# Patient Record
Sex: Female | Born: 1985 | Race: White | Hispanic: Yes | Marital: Single | Smoking: Never smoker
Health system: Southern US, Community
[De-identification: ages and names within clinical notes are randomized; demographics above are authoritative.]

## PROBLEM LIST (undated history)

## (undated) DIAGNOSIS — F419 Anxiety disorder, unspecified: Secondary | ICD-10-CM

## (undated) DIAGNOSIS — G932 Benign intracranial hypertension: Secondary | ICD-10-CM

## (undated) HISTORY — PX: NO PAST SURGERIES: SHX2092

## (undated) HISTORY — DX: Anxiety disorder, unspecified: F41.9

## (undated) HISTORY — DX: Benign intracranial hypertension: G93.2

---

## 2005-06-16 ENCOUNTER — Emergency Department (HOSPITAL_COMMUNITY): Admission: EM | Admit: 2005-06-16 | Discharge: 2005-06-16 | Payer: Self-pay | Admitting: Emergency Medicine

## 2005-06-17 ENCOUNTER — Emergency Department (HOSPITAL_COMMUNITY): Admission: EM | Admit: 2005-06-17 | Discharge: 2005-06-17 | Payer: Self-pay | Admitting: Emergency Medicine

## 2005-10-17 ENCOUNTER — Ambulatory Visit: Payer: Self-pay | Admitting: *Deleted

## 2005-10-21 ENCOUNTER — Ambulatory Visit: Payer: Self-pay | Admitting: *Deleted

## 2005-10-21 ENCOUNTER — Inpatient Hospital Stay (HOSPITAL_COMMUNITY): Admission: AD | Admit: 2005-10-21 | Discharge: 2005-10-24 | Payer: Self-pay | Admitting: Obstetrics & Gynecology

## 2005-10-21 ENCOUNTER — Ambulatory Visit: Payer: Self-pay | Admitting: Obstetrics and Gynecology

## 2006-07-21 ENCOUNTER — Ambulatory Visit: Payer: Self-pay | Admitting: Family Medicine

## 2006-07-22 ENCOUNTER — Ambulatory Visit: Payer: Self-pay | Admitting: *Deleted

## 2006-12-23 ENCOUNTER — Ambulatory Visit: Payer: Self-pay | Admitting: Family Medicine

## 2007-01-27 ENCOUNTER — Ambulatory Visit: Payer: Self-pay | Admitting: Family Medicine

## 2007-03-16 ENCOUNTER — Ambulatory Visit: Payer: Self-pay | Admitting: Family Medicine

## 2007-07-29 ENCOUNTER — Encounter (INDEPENDENT_AMBULATORY_CARE_PROVIDER_SITE_OTHER): Payer: Self-pay | Admitting: *Deleted

## 2008-09-05 ENCOUNTER — Encounter: Payer: Self-pay | Admitting: Obstetrics & Gynecology

## 2008-09-05 ENCOUNTER — Ambulatory Visit: Payer: Self-pay | Admitting: Obstetrics & Gynecology

## 2008-09-06 ENCOUNTER — Encounter: Payer: Self-pay | Admitting: Obstetrics & Gynecology

## 2008-10-29 ENCOUNTER — Emergency Department (HOSPITAL_COMMUNITY): Admission: EM | Admit: 2008-10-29 | Discharge: 2008-10-29 | Payer: Self-pay | Admitting: Emergency Medicine

## 2009-09-22 ENCOUNTER — Ambulatory Visit (HOSPITAL_COMMUNITY): Admission: RE | Admit: 2009-09-22 | Discharge: 2009-09-22 | Payer: Self-pay | Admitting: Obstetrics

## 2009-11-17 ENCOUNTER — Ambulatory Visit (HOSPITAL_COMMUNITY): Admission: RE | Admit: 2009-11-17 | Discharge: 2009-11-17 | Payer: Self-pay | Admitting: Obstetrics

## 2010-01-16 ENCOUNTER — Inpatient Hospital Stay (HOSPITAL_COMMUNITY): Admission: AD | Admit: 2010-01-16 | Discharge: 2010-01-16 | Payer: Self-pay | Admitting: Obstetrics

## 2010-01-18 ENCOUNTER — Inpatient Hospital Stay (HOSPITAL_COMMUNITY): Admission: AD | Admit: 2010-01-18 | Discharge: 2010-01-20 | Payer: Self-pay | Admitting: Obstetrics

## 2010-03-20 ENCOUNTER — Emergency Department (HOSPITAL_COMMUNITY): Admission: EM | Admit: 2010-03-20 | Discharge: 2010-03-20 | Payer: Self-pay | Admitting: Emergency Medicine

## 2011-01-29 LAB — COMPREHENSIVE METABOLIC PANEL
Alkaline Phosphatase: 175 U/L — ABNORMAL HIGH (ref 39–117)
CO2: 28 mEq/L (ref 19–32)
Calcium: 8.7 mg/dL (ref 8.4–10.5)
Glucose, Bld: 133 mg/dL — ABNORMAL HIGH (ref 70–99)
Potassium: 3.6 mEq/L (ref 3.5–5.1)
Sodium: 140 mEq/L (ref 135–145)

## 2011-01-29 LAB — CBC
Hemoglobin: 13.1 g/dL (ref 12.0–15.0)
MCHC: 35 g/dL (ref 30.0–36.0)

## 2011-01-29 LAB — POCT PREGNANCY, URINE: Preg Test, Ur: NEGATIVE

## 2011-01-29 LAB — DIFFERENTIAL
Basophils Absolute: 0 10*3/uL (ref 0.0–0.1)
Eosinophils Absolute: 0.1 10*3/uL (ref 0.0–0.7)
Lymphocytes Relative: 28 % (ref 12–46)
Monocytes Relative: 6 % (ref 3–12)
Neutrophils Relative %: 65 % (ref 43–77)

## 2011-01-29 LAB — URINALYSIS, ROUTINE W REFLEX MICROSCOPIC
Bilirubin Urine: NEGATIVE
Ketones, ur: NEGATIVE mg/dL
Leukocytes, UA: NEGATIVE
pH: 5 (ref 5.0–8.0)

## 2011-01-29 LAB — URINE MICROSCOPIC-ADD ON

## 2011-01-29 LAB — LIPASE, BLOOD: Lipase: 33 U/L (ref 11–59)

## 2011-02-03 LAB — CBC
HCT: 31.5 % — ABNORMAL LOW (ref 36.0–46.0)
Hemoglobin: 13.1 g/dL (ref 12.0–15.0)
MCHC: 33.2 g/dL (ref 30.0–36.0)
MCV: 90.5 fL (ref 78.0–100.0)
Platelets: 117 10*3/uL — ABNORMAL LOW (ref 150–400)
RBC: 4.42 MIL/uL (ref 3.87–5.11)
RDW: 14.1 % (ref 11.5–15.5)
WBC: 8.9 10*3/uL (ref 4.0–10.5)

## 2011-03-26 NOTE — Assessment & Plan Note (Signed)
NAMEZAKARI, Savannah [name]              ACCOUNT NO.:  [id number]   MEDICAL RECORD NO.:  [id number]          PATIENT TYPE:  POB   LOCATION:  [facility]         FACILITY:  [facility]   PHYSICIAN:  Elsie Lincoln, MD      DATE OF BIRTH:  Feb 14, 1986   DATE OF SERVICE:  09/05/2008                                  CLINIC NOTE   HISTORY:  The patient is a 25 year old G1, para 1-0-0-1, who comes to [place]  for yearly exam.  Her only complaint is that she has not had a period  for 3 months.  She has taken multiple home urine pregnancy tests, which  have been negative, and her urine pregnancy test here is also negative.  The patient would mind if she was pregnant; however, I do not believe  that she is.  She denies nipple discharge or lethargy, intolerance to  cold, or hair loss.  She has been having extremely normal periods with  moderate flow up until 3 months ago.   PAST MEDICAL HISTORY:  Denies all problems.   PAST SURGICAL HISTORY:  Denies all surgeries.   PAST GYN HISTORY:  No history of ovarian cysts, fibroid tumors, or  sexually transmitted diseases.  The patient did have an abnormal Pap  smear in 2008 during her pregnancy.  Her repeat Pap was normal and that  was just inflammation.  She uses condoms for birth control.   FAMILY HISTORY:  Negative for all familial cancer and her grandmother  has diabetes.   SOCIAL HISTORY:  The patient lives with her husband and son.  Does not  smoke, drink, or do drugs, but does have an occasional caffeinated  beverage.  Systemic review is negative for all problems.   MEDICATIONS:  None.   ALLERGIES:  None.   PHYSICAL EXAMINATION:  VITAL SIGNS:  Pulse 71, blood pressure 123/74,  weight 162.  GENERAL:  Well nourished, well developed, in no apparent distress.  HEENT:  Normocephalic, atraumatic.  NECK:  Thyroid, no masses.  LUNGS:  Clear to auscultation bilaterally.  HEART:  Regular rate and rhythm.  BREASTS:  No masses.  Nontender.  No  lymphadenopathy.  ABDOMEN:  Soft, nontender.  No organomegaly.  No hernia.  GENITALIA:  Tanner V.  Vagina pink, normal rugae.  Cervical os slightly  open and questionable endometrial polyp that can be seen and felt.  It  is not extruding from the os, but I do believe this is an endocervical  polyp.  It does not bleed after sex, the patient stated.  Uterus small,  nontender.  Adnexa no masses, nontender.  EXTREMITIES:  Nontender.  SKIN:  No hirsutism.  Of note, small amount of hair on the abdomen, but  nothing abnormal.   ASSESSMENT/PLAN:  A 25 year old female for a yearly exam.  1. Pap smear and cultures.  2. Secondary amenorrhea, which had quantitative TSH and prolactin, and      gave her Provera withdrawal bleed.  If the quantitative is      negative, she will call in a prescription for Provera to the [address]      [facility] [employer].  3. We could do more PCO workup  if these are negative.  The patient is      self-pay and I want to conduct test wisely.           ______________________________  Elsie Lincoln, MD     KL/MEDQ  D:  09/05/2008  T:  09/06/2008  Job:  [id number]

## 2012-07-15 LAB — OB RESULTS CONSOLE GC/CHLAMYDIA
Chlamydia: NEGATIVE
Gonorrhea: NEGATIVE

## 2012-07-15 LAB — OB RESULTS CONSOLE ABO/RH: RH Type: POSITIVE

## 2012-07-15 LAB — OB RESULTS CONSOLE HEPATITIS B SURFACE ANTIGEN: Hepatitis B Surface Ag: NEGATIVE

## 2012-11-11 NOTE — L&D Delivery Note (Signed)
Delivery Note At 12:52 AM a viable female was delivered via  (Presentation: ;  ).  APGAR: , ; weight .   Placenta status: , .  Cord:  with the following complications: .  Cord pH: not done  Anesthesia:   Episiotomy:  Lacerations:  Suture Repair: 2.0 vicryl Est. Blood Loss (mL):   Mom to postpartum.  Baby to nursery-stable.  MARSHALL,BERNARD A 01/08/2013, 1:12 AM

## 2012-12-08 LAB — OB RESULTS CONSOLE GBS: GBS: NEGATIVE

## 2013-01-06 ENCOUNTER — Telehealth (HOSPITAL_COMMUNITY): Payer: Self-pay | Admitting: *Deleted

## 2013-01-06 ENCOUNTER — Encounter (HOSPITAL_COMMUNITY): Payer: Self-pay | Admitting: *Deleted

## 2013-01-06 NOTE — Telephone Encounter (Signed)
Preadmission screen Interpreter number [phone]

## 2013-01-07 ENCOUNTER — Encounter (HOSPITAL_COMMUNITY): Payer: Self-pay | Admitting: Anesthesiology

## 2013-01-07 ENCOUNTER — Inpatient Hospital Stay (HOSPITAL_COMMUNITY): Payer: Medicaid Other | Admitting: Anesthesiology

## 2013-01-07 ENCOUNTER — Inpatient Hospital Stay (HOSPITAL_COMMUNITY)
Admission: AD | Admit: 2013-01-07 | Discharge: 2013-01-09 | DRG: 775 | Disposition: A | Discharge status: Home / Self Care | Payer: Medicaid Other | Source: Ambulatory Visit | Attending: Obstetrics | Admitting: Obstetrics

## 2013-01-07 ENCOUNTER — Encounter (HOSPITAL_COMMUNITY): Payer: Self-pay

## 2013-01-07 LAB — CBC
HCT: 36 % (ref 36.0–46.0)
Hemoglobin: 12.4 g/dL (ref 12.0–15.0)
MCH: 29.2 pg (ref 26.0–34.0)
MCHC: 34.4 g/dL (ref 30.0–36.0)
MCV: 84.9 fL (ref 78.0–100.0)
RDW: 13.6 % (ref 11.5–15.5)

## 2013-01-07 MED ORDER — FLEET ENEMA 7-19 GM/118ML RE ENEM: 1.0000 | ENEMA | Freq: Every day | RECTAL | Status: DC | PRN

## 2013-01-07 MED ORDER — PHENYLEPHRINE 40 MCG/ML (10ML) SYRINGE FOR IV PUSH (FOR BLOOD PRESSURE SUPPORT)
80.0000 ug | PREFILLED_SYRINGE | INTRAVENOUS | Status: DC | PRN
  Filled 2013-01-07: qty 5

## 2013-01-07 MED ORDER — OXYTOCIN 40 UNITS IN LACTATED RINGERS INFUSION - SIMPLE MED
1.0000 m[IU]/min | INTRAVENOUS | Status: DC
  Administered 2013-01-07: 2 m[IU]/min via INTRAVENOUS
  Filled 2013-01-07: qty 1000

## 2013-01-07 MED ORDER — OXYTOCIN BOLUS FROM INFUSION
500.0000 mL | INTRAVENOUS | Status: DC
  Administered 2013-01-08: 500 mL via INTRAVENOUS

## 2013-01-07 MED ORDER — ACETAMINOPHEN 325 MG PO TABS: 650.0000 mg | ORAL_TABLET | ORAL | Status: DC | PRN

## 2013-01-07 MED ORDER — LIDOCAINE HCL (PF) 1 % IJ SOLN
30.0000 mL | INTRAMUSCULAR | Status: AC | PRN
  Administered 2013-01-07: 30 mL via SUBCUTANEOUS
  Filled 2013-01-07 (×2): qty 30

## 2013-01-07 MED ORDER — LACTATED RINGERS IV SOLN
500.0000 mL | Freq: Once | INTRAVENOUS | Status: AC
  Administered 2013-01-07: 500 mL via INTRAVENOUS

## 2013-01-07 MED ORDER — CITRIC ACID-SODIUM CITRATE 334-500 MG/5ML PO SOLN: 30.0000 mL | ORAL | Status: DC | PRN

## 2013-01-07 MED ORDER — BUTORPHANOL TARTRATE 1 MG/ML IJ SOLN
1.0000 mg | INTRAMUSCULAR | Status: DC | PRN
  Administered 2013-01-07: 1 mg via INTRAVENOUS
  Filled 2013-01-07: qty 1

## 2013-01-07 MED ORDER — ONDANSETRON HCL 4 MG/2ML IJ SOLN: 4.0000 mg | Freq: Four times a day (QID) | INTRAMUSCULAR | Status: DC | PRN

## 2013-01-07 MED ORDER — LACTATED RINGERS IV SOLN
INTRAVENOUS | Status: DC
  Administered 2013-01-07 – 2013-01-08 (×2): via INTRAVENOUS

## 2013-01-07 MED ORDER — SODIUM BICARBONATE 8.4 % IV SOLN
INTRAVENOUS | Status: DC | PRN
  Administered 2013-01-07: 5 mL via EPIDURAL

## 2013-01-07 MED ORDER — OXYCODONE-ACETAMINOPHEN 5-325 MG PO TABS
1.0000 | ORAL_TABLET | ORAL | Status: DC | PRN
  Administered 2013-01-08: 1 via ORAL
  Filled 2013-01-07 (×2): qty 1

## 2013-01-07 MED ORDER — PHENYLEPHRINE 40 MCG/ML (10ML) SYRINGE FOR IV PUSH (FOR BLOOD PRESSURE SUPPORT): 80.0000 ug | PREFILLED_SYRINGE | INTRAVENOUS | Status: DC | PRN

## 2013-01-07 MED ORDER — IBUPROFEN 600 MG PO TABS
600.0000 mg | ORAL_TABLET | Freq: Four times a day (QID) | ORAL | Status: DC | PRN
  Filled 2013-01-07 (×5): qty 1

## 2013-01-07 MED ORDER — EPHEDRINE 5 MG/ML INJ
10.0000 mg | INTRAVENOUS | Status: DC | PRN
  Filled 2013-01-07: qty 4

## 2013-01-07 MED ORDER — OXYTOCIN 40 UNITS IN LACTATED RINGERS INFUSION - SIMPLE MED: 62.5000 mL/h | INTRAVENOUS | Status: DC

## 2013-01-07 MED ORDER — TERBUTALINE SULFATE 1 MG/ML IJ SOLN: 0.2500 mg | Freq: Once | INTRAMUSCULAR | Status: AC | PRN

## 2013-01-07 MED ORDER — DIPHENHYDRAMINE HCL 50 MG/ML IJ SOLN: 12.5000 mg | INTRAMUSCULAR | Status: DC | PRN

## 2013-01-07 MED ORDER — FENTANYL 2.5 MCG/ML BUPIVACAINE 1/10 % EPIDURAL INFUSION (WH - ANES)
14.0000 mL/h | INTRAMUSCULAR | Status: DC
  Administered 2013-01-07: 14 mL/h via EPIDURAL
  Filled 2013-01-07: qty 125

## 2013-01-07 MED ORDER — LACTATED RINGERS IV SOLN
500.0000 mL | INTRAVENOUS | Status: DC | PRN
  Administered 2013-01-08: 500 mL via INTRAVENOUS

## 2013-01-07 MED ORDER — EPHEDRINE 5 MG/ML INJ: 10.0000 mg | INTRAVENOUS | Status: DC | PRN

## 2013-01-07 NOTE — Anesthesia Procedure Notes (Signed)

## 2013-01-07 NOTE — MAU Note (Addendum)
PT SAYS UC HURT - Q15 MIN.    VE - 3 CM ON  Tuesday.   HAS BROWNISH D/C.    DENIES HSV AND MRSA.   INDUCTION ON 01-12-2013 AT 0730

## 2013-01-07 NOTE — MAU Note (Signed)
Pt states having uc's since yesterday and are closer together this evening.

## 2013-01-07 NOTE — Anesthesia Preprocedure Evaluation (Signed)

## 2013-01-08 ENCOUNTER — Encounter (HOSPITAL_COMMUNITY): Payer: Self-pay | Admitting: Family Medicine

## 2013-01-08 LAB — CBC
Hemoglobin: 10.9 g/dL — ABNORMAL LOW (ref 12.0–15.0)
RBC: 3.79 MIL/uL — ABNORMAL LOW (ref 3.87–5.11)
WBC: 14 10*3/uL — ABNORMAL HIGH (ref 4.0–10.5)

## 2013-01-08 LAB — ABO/RH: ABO/RH(D): A POS

## 2013-01-08 MED ORDER — ZOLPIDEM TARTRATE 5 MG PO TABS: 5.0000 mg | ORAL_TABLET | Freq: Every evening | ORAL | Status: DC | PRN

## 2013-01-08 MED ORDER — BENZOCAINE-MENTHOL 20-0.5 % EX AERO
1.0000 "application " | INHALATION_SPRAY | CUTANEOUS | Status: DC | PRN
  Administered 2013-01-08: 1 via TOPICAL
  Filled 2013-01-08: qty 56

## 2013-01-08 MED ORDER — ONDANSETRON HCL 4 MG/2ML IJ SOLN: 4.0000 mg | INTRAMUSCULAR | Status: DC | PRN

## 2013-01-08 MED ORDER — TETANUS-DIPHTH-ACELL PERTUSSIS 5-2.5-18.5 LF-MCG/0.5 IM SUSP
0.5000 mL | Freq: Once | INTRAMUSCULAR | Status: AC
  Administered 2013-01-09: 0.5 mL via INTRAMUSCULAR

## 2013-01-08 MED ORDER — WITCH HAZEL-GLYCERIN EX PADS
1.0000 "application " | MEDICATED_PAD | CUTANEOUS | Status: DC | PRN
  Administered 2013-01-08: 1 via TOPICAL

## 2013-01-08 MED ORDER — DIBUCAINE 1 % RE OINT
1.0000 "application " | TOPICAL_OINTMENT | RECTAL | Status: DC | PRN
  Administered 2013-01-08: 1 via RECTAL
  Filled 2013-01-08: qty 28

## 2013-01-08 MED ORDER — OXYCODONE-ACETAMINOPHEN 5-325 MG PO TABS
1.0000 | ORAL_TABLET | ORAL | Status: DC | PRN
  Administered 2013-01-08: 1 via ORAL

## 2013-01-08 MED ORDER — PRENATAL MULTIVITAMIN CH
1.0000 | ORAL_TABLET | Freq: Every day | ORAL | Status: DC
  Administered 2013-01-08: 1 via ORAL
  Filled 2013-01-08: qty 1

## 2013-01-08 MED ORDER — DIPHENHYDRAMINE HCL 25 MG PO CAPS: 25.0000 mg | ORAL_CAPSULE | Freq: Four times a day (QID) | ORAL | Status: DC | PRN

## 2013-01-08 MED ORDER — SENNOSIDES-DOCUSATE SODIUM 8.6-50 MG PO TABS
2.0000 | ORAL_TABLET | Freq: Every day | ORAL | Status: DC
  Administered 2013-01-08: 2 via ORAL

## 2013-01-08 MED ORDER — ONDANSETRON HCL 4 MG PO TABS: 4.0000 mg | ORAL_TABLET | ORAL | Status: DC | PRN

## 2013-01-08 MED ORDER — FERROUS SULFATE 325 (65 FE) MG PO TABS
325.0000 mg | ORAL_TABLET | Freq: Two times a day (BID) | ORAL | Status: DC
  Administered 2013-01-08 – 2013-01-09 (×3): 325 mg via ORAL
  Filled 2013-01-08 (×3): qty 1

## 2013-01-08 MED ORDER — LANOLIN HYDROUS EX OINT: TOPICAL_OINTMENT | CUTANEOUS | Status: DC | PRN

## 2013-01-08 MED ORDER — IBUPROFEN 600 MG PO TABS
600.0000 mg | ORAL_TABLET | Freq: Four times a day (QID) | ORAL | Status: DC
  Administered 2013-01-08 – 2013-01-09 (×6): 600 mg via ORAL
  Filled 2013-01-08: qty 1

## 2013-01-08 MED ORDER — SIMETHICONE 80 MG PO CHEW: 80.0000 mg | CHEWABLE_TABLET | ORAL | Status: DC | PRN

## 2013-01-08 NOTE — Progress Notes (Signed)

## 2013-01-08 NOTE — Progress Notes (Signed)
UR chart review completed.  

## 2013-01-08 NOTE — Progress Notes (Signed)
Patient ID: Savannah [name], female   DOB: [birth year], 27 y.o.   MRN: [id number] vs signs normal fundus firm Doing well

## 2013-01-08 NOTE — Anesthesia Postprocedure Evaluation (Signed)
  Anesthesia Post-op Note  Patient: Savannah [name]  Procedure(s) Performed: * No procedures listed *  Patient Location: Mother/Baby  Anesthesia Type:Epidural  Level of Consciousness: awake, alert  and oriented  Airway and Oxygen Therapy: Patient Spontanous Breathing  Post-op Pain: mild  Post-op Assessment: Patient's Cardiovascular Status Stable and Respiratory Function Stable  Post-op Vital Signs: stable  Complications: No apparent anesthesia complications

## 2013-01-08 NOTE — H&P (Signed)
This is Dr. Francoise Ceo dictating the history and physical on  Savannah [name] she's a 27 year old gravida 3 para 202 at 40 weeks and 3 days EDC 01/05/2013 negative GBS admitted in labor 5 cm 100% and made rapid progress membranes ruptured artificially when she was fully dilated at a normal vaginal delivery of a female Apgar 8 and 9 placenta spontaneous and she had a second-degree perineal which was repaired with 2-0 Vicryl Past medical history negative Past surgical history negative Social history negative System review noncontributory Physical exam revealed a well-developed female post delivery HEENT negative Breasts negative Lungs clear to P&A Heart regular rhythm no murmurs no gallops Uterus 20 week postpartum size Extremities negative and

## 2013-01-09 MED ORDER — OXYCODONE-ACETAMINOPHEN 5-325 MG PO TABS: 1.0000 | ORAL_TABLET | Freq: Four times a day (QID) | ORAL | Status: DC | PRN

## 2013-01-09 MED ORDER — PRENATAL MULTIVITAMIN CH: 1.0000 | ORAL_TABLET | Freq: Every day | ORAL | Status: DC

## 2013-01-09 NOTE — Discharge Summary (Signed)
  Obstetric Discharge Summary Reason for Admission: onset of labor Prenatal Procedures: none Intrapartum Procedures: spontaneous vaginal delivery Postpartum Procedures: none Complications-Operative and Postpartum: none  Hemoglobin  Date Value Range Status  01/08/2013 10.9* 12.0 - 15.0 g/dL Final     HCT  Date Value Range Status  01/08/2013 32.4* 36.0 - 46.0 % Final    Physical Exam:  General: alert Lochia: appropriate Uterine: firm Incision: n/a DVT Evaluation: No evidence of DVT seen on physical exam.  Discharge Diagnoses: Active Problems:   Normal delivery   Discharge Information: Date: 01/09/2013 Activity: pelvic rest Diet: routine Medications:  Prior to Admission medications   Medication Sig Start Date End Date Taking? Authorizing Provider  oxyCODONE-acetaminophen (PERCOCET/ROXICET) 5-325 MG per tablet Take 1-2 tablets by mouth every 6 (six) hours as needed. 01/09/13   Antionette Char, MD    Condition: stable Instructions: refer to routine discharge instructions Discharge to: home Follow-up Information   Schedule an appointment as soon as possible for a visit with Kathreen Cosier, MD.   Contact information:   [address] [place] [address] [phone]       Newborn Data: Live born  Information for the patient's newborn:  Shayanna, Thatch [[id number]]  female ; APGAR (1 MIN): 8   APGAR (5 MINS): 9    Home with mother.  JACKSON-MOORE,Oleda Borski A 01/09/2013, 9:55 AM

## 2013-01-12 ENCOUNTER — Inpatient Hospital Stay (HOSPITAL_COMMUNITY): Admission: RE | Admit: 2013-01-12 | Payer: Self-pay | Source: Ambulatory Visit

## 2014-09-12 ENCOUNTER — Encounter (HOSPITAL_COMMUNITY): Payer: Self-pay | Admitting: Family Medicine

## 2016-08-02 LAB — HM PAP SMEAR

## 2017-03-31 ENCOUNTER — Emergency Department (HOSPITAL_COMMUNITY)
Admission: EM | Admit: 2017-03-31 | Discharge: 2017-04-01 | Disposition: A | Discharge status: Home / Self Care | Payer: Medicaid Other | Attending: Emergency Medicine | Admitting: Emergency Medicine

## 2017-03-31 ENCOUNTER — Emergency Department (HOSPITAL_COMMUNITY): Payer: Medicaid Other

## 2017-03-31 ENCOUNTER — Encounter (HOSPITAL_COMMUNITY): Payer: Self-pay | Admitting: Emergency Medicine

## 2017-03-31 DIAGNOSIS — R930 Abnormal findings on diagnostic imaging of skull and head, not elsewhere classified: Secondary | ICD-10-CM | POA: Insufficient documentation

## 2017-03-31 DIAGNOSIS — R519 Headache, unspecified: Secondary | ICD-10-CM

## 2017-03-31 DIAGNOSIS — G932 Benign intracranial hypertension: Secondary | ICD-10-CM

## 2017-03-31 DIAGNOSIS — R51 Headache: Secondary | ICD-10-CM

## 2017-03-31 LAB — BASIC METABOLIC PANEL
ANION GAP: 6 (ref 5–15)
BUN: 15 mg/dL (ref 6–20)
CO2: 26 mmol/L (ref 22–32)
Calcium: 9.3 mg/dL (ref 8.9–10.3)
Chloride: 107 mmol/L (ref 101–111)
Creatinine, Ser: 0.89 mg/dL (ref 0.44–1.00)
GFR calc Af Amer: >60 mL/min (ref >60)
GLUCOSE: 101 mg/dL — AB (ref 65–99)
POTASSIUM: 3.8 mmol/L (ref 3.5–5.1)
Sodium: 139 mmol/L (ref 135–145)

## 2017-03-31 LAB — CBC WITH DIFFERENTIAL/PLATELET
Basophils Absolute: 0 10*3/uL (ref 0.0–0.1)
Basophils Relative: 0 %
Eosinophils Absolute: 0.1 10*3/uL (ref 0.0–0.7)
Eosinophils Relative: 2 %
HEMATOCRIT: 41.6 % (ref 36.0–46.0)
HEMOGLOBIN: 14.3 g/dL (ref 12.0–15.0)
LYMPHS PCT: 37 %
Lymphs Abs: 2.8 10*3/uL (ref 0.7–4.0)
MCH: 31.1 pg (ref 26.0–34.0)
MCHC: 34.4 g/dL (ref 30.0–36.0)
MCV: 90.4 fL (ref 78.0–100.0)
Monocytes Absolute: 0.6 10*3/uL (ref 0.1–1.0)
Monocytes Relative: 8 %
NEUTROS ABS: 4.1 10*3/uL (ref 1.7–7.7)
NEUTROS PCT: 53 %
PLATELETS: 235 10*3/uL (ref 150–400)
RBC: 4.6 MIL/uL (ref 3.87–5.11)
RDW: 12.7 % (ref 11.5–15.5)
WBC: 7.6 10*3/uL (ref 4.0–10.5)

## 2017-03-31 LAB — I-STAT BETA HCG BLOOD, ED (MC, WL, AP ONLY)

## 2017-03-31 MED ORDER — ACETAZOLAMIDE 250 MG PO TABS
500.0000 mg | ORAL_TABLET | Freq: Once | ORAL | Status: AC
  Administered 2017-03-31: 500 mg via ORAL
  Filled 2017-03-31: qty 2

## 2017-03-31 MED ORDER — LIDOCAINE HCL 2 % IJ SOLN
10.0000 mL | Freq: Once | INTRAMUSCULAR | Status: AC
  Administered 2017-03-31: 200 mg
  Filled 2017-03-31: qty 20

## 2017-03-31 MED ORDER — OXYCODONE-ACETAMINOPHEN 5-325 MG PO TABS
1.0000 | ORAL_TABLET | Freq: Once | ORAL | Status: AC
  Administered 2017-03-31: 1 via ORAL
  Filled 2017-03-31: qty 1

## 2017-03-31 MED ORDER — LIDOCAINE HCL 2 % IJ SOLN
10.0000 mL | Freq: Once | INTRAMUSCULAR | Status: AC
  Administered 2017-03-31: 200 mg

## 2017-03-31 NOTE — ED Notes (Signed)
Pt states she "went to the eye dr for my glasses and he told me to come here". Pt has paper with her from Dr stating bilateral optic nerve edema. Pt denies any issue with vision outside of needing glasses.

## 2017-03-31 NOTE — ED Notes (Signed)
Pt transported to MRI 

## 2017-03-31 NOTE — ED Notes (Signed)
Attempted to get pt's blood twice, without success.

## 2017-03-31 NOTE — ED Notes (Signed)
ED Provider at bedside. 

## 2017-03-31 NOTE — ED Triage Notes (Signed)
Sent to ED from Eye dr with OPTIC NERVE EDEMA to be examined -- was sent to neurology office and was told that they could not see her.

## 2017-03-31 NOTE — ED Provider Notes (Signed)
[facility]EMERGENCY DEPT Provider Note   CSN: [id number] Arrival date & time: 03/31/17  1317     History   Chief Complaint Chief Complaint  Patient presents with  . optic nerve edema    HPI Savannah [name] is a 31 y.o. female.  The history is provided by the patient. No language interpreter was used.   Savannah [name] is a 31 y.o. female who presents to the Emergency Department complaining of optic nerve edema.  She went for [facility] appointment [facility] for evaluation and was referred to the emergency department for bilateral optic nerve edema. She reports headaches for the last 2 weeks across the front of her head. The headaches are at times worse with noise. She has intermittent blurred vision. No fevers, chest pain, cough, bowel pain, nausea, vomiting, numbness, weakness. Symptoms are mild and waxing and waning in nature. Past Medical History:  Diagnosis Date  . Anxiety     Patient Active Problem List   Diagnosis Date Noted  . Normal delivery 01/09/2013    Past Surgical History:  Procedure Laterality Date  . NO PAST SURGERIES      OB History    Gravida Para Term Preterm AB Living   3 3 3  0 0 3   SAB TAB Ectopic Multiple Live Births   0 0 0 0 3       Home Medications    Prior to Admission medications   Medication Sig Start Date End Date Taking? Authorizing Provider  oxyCODONE-acetaminophen (PERCOCET/ROXICET) 5-325 MG per tablet Take 1-2 tablets by mouth every 6 (six) hours as needed. 01/09/13   Antionette CharJackson-Moore, Lisa, MD  Prenatal Vit-Fe Fumarate-FA (PRENATAL MULTIVITAMIN) TABS Take 1 tablet by mouth daily at 12 noon. 01/09/13   Antionette CharJackson-Moore, Lisa, MD    Family History No family history on file.  Social History Social History  Substance Use Topics  . Smoking status: Never Smoker  . Smokeless tobacco: Never Used  . Alcohol use No     Allergies   Patient has no known allergies.   Review of Systems Review of Systems    All other systems reviewed and are negative.    Physical Exam Updated Vital Signs BP 130/85 (BP Location: Left Arm)   Pulse 66   Temp 98.1 F (36.7 C) (Oral)   Resp 16   Ht 5\' 5"  (1.651 m)   Wt 78.5 kg (173 lb)   SpO2 100%   BMI 28.79 kg/m   Physical Exam  Constitutional: She is oriented to person, place, and time. She appears well-developed and well-nourished.  HENT:  Head: Normocephalic and atraumatic.  Eyes: EOM are normal. Pupils are equal, round, and reactive to light.  Cardiovascular: Normal rate and regular rhythm.   No murmur heard. Pulmonary/Chest: Effort normal and breath sounds normal. No respiratory distress.  Abdominal: Soft. There is no tenderness. There is no rebound and no guarding.  Musculoskeletal: She exhibits no edema or tenderness.  Neurological: She is alert and oriented to person, place, and time. No cranial nerve deficit or sensory deficit. Coordination normal.  5 out of 5 strength in all 4 extremities.  Skin: Skin is warm and dry.  Psychiatric: She has a normal mood and affect. Her behavior is normal.  Nursing note and vitals reviewed.    ED Treatments / Results  Labs (all labs ordered are listed, but only abnormal results are displayed) Labs Reviewed  CBC WITH DIFFERENTIAL/PLATELET  BASIC METABOLIC PANEL  EKG  EKG Interpretation None       Radiology Ct Head Wo Contrast  Result Date: 03/31/2017 CLINICAL DATA:  31 y/o F; bilateral optic nerve edema on eye examination today. EXAM: CT HEAD WITHOUT CONTRAST TECHNIQUE: Contiguous axial images were obtained from the base of the skull through the vertex without intravenous contrast. COMPARISON:  None. FINDINGS: Brain: No evidence of acute infarction, hemorrhage, hydrocephalus, extra-axial collection or mass lesion/mass effect. Partially empty sella turcica. Vascular: No hyperdense vessel or unexpected calcification. Skull: Normal. Negative for fracture or focal lesion. Sinuses/Orbits: No  acute finding. Other: None. IMPRESSION: 1. No acute intracranial abnormality. 2. Incidental partially empty sella turcica. This finding can be seen with idiopathic intracranial hypertension. 3. Otherwise unremarkable CT of head for age. Electronically Signed   By: Mitzi Hansen M.D.   On: 03/31/2017 15:31    Procedures .Lumbar Puncture Date/Time: 04/01/2017 1:14 AM Performed by: Tilden Fossa Authorized by: Tilden Fossa   Consent:    Consent obtained:  Verbal and written   Consent given by:  Patient   Risks discussed:  Bleeding, infection, pain, headache, nerve damage and repeat procedure   Alternatives discussed:  Observation Pre-procedure details:    Procedure purpose:  Diagnostic   Preparation: Patient was prepped and draped in usual sterile fashion   Anesthesia (see MAR for exact dosages):    Anesthesia method:  Local infiltration   Local anesthetic:  Lidocaine 2% w/o epi Procedure details:    Lumbar space:  L3-L4 interspace   Patient position:  L lateral decubitus   Needle gauge:  22   Needle length (in):  3.5   Number of attempts:  1   Opening pressure (cm H2O):  40   Closing pressure (cm H2O):  27   Fluid appearance:  Clear   Tubes of fluid:  4   Total volume (ml):  6 Post-procedure:    Puncture site:  Adhesive bandage applied   Patient tolerance of procedure:  Tolerated well, no immediate complications   (including critical care time)  Medications Ordered in ED Medications - No data to display   Initial Impression / Assessment and Plan / ED Course  I have reviewed the triage vital signs and the nursing notes.  Pertinent labs & imaging results that were available during my care of the patient were reviewed by me and considered in my medical decision making (see chart for details).     Patient here following referral from ophthalmology for papilledema. She is in no distress in the emergency department with a nonfocal neurologic examination.  Discussed with Dr. Amada Jupiter with neurology, recommend obtain MR venogram and if no evidence of dural sinus thrombosis obtain lumbar puncture to violate for pseudotumor. Lumbar puncture performed demonstrates elevated opening pressure and 40. Discussed the case with Dr. Otelia Limes with neurology, recommend starting Diamox 500 mg twice a day with close neurology and ophthalmology follow-up. Case management consulted for assistance in establishing follow up care.    Final Clinical Impressions(s) / ED Diagnoses   Final diagnoses:  None    New Prescriptions New Prescriptions   No medications on file     Tilden Fossa, MD 04/01/17 2034

## 2017-04-01 LAB — CSF CELL COUNT WITH DIFFERENTIAL
RBC COUNT CSF: 54 /mm3 — AB
RBC Count, CSF: 3 /mm3 — ABNORMAL HIGH
Tube #: 1
Tube #: 4
WBC CSF: 1 /mm3 (ref 0–5)
WBC CSF: 2 /mm3 (ref 0–5)

## 2017-04-01 LAB — CRYPTOCOCCAL ANTIGEN, CSF: Crypto Ag: NEGATIVE

## 2017-04-01 LAB — GLUCOSE, CSF: GLUCOSE CSF: 60 mg/dL (ref 40–70)

## 2017-04-01 LAB — PROTEIN, CSF: TOTAL PROTEIN, CSF: 42 mg/dL (ref 15–45)

## 2017-04-01 MED ORDER — ACETAZOLAMIDE 250 MG PO TABS: 500.0000 mg | ORAL_TABLET | Freq: Two times a day (BID) | ORAL | 0 refills | Status: DC

## 2017-04-01 MED ORDER — IBUPROFEN 800 MG PO TABS
800.0000 mg | ORAL_TABLET | Freq: Once | ORAL | Status: AC
  Administered 2017-04-01: 800 mg via ORAL
  Filled 2017-04-01: qty 1

## 2017-04-01 NOTE — ED Notes (Signed)
ED Provider at bedside. 

## 2017-04-02 ENCOUNTER — Telehealth: Payer: Self-pay | Admitting: Surgery

## 2017-04-02 NOTE — Telephone Encounter (Signed)
ED CM received consult from Dr. Pecola Leisureeese concerning patient needing ED folow up. Patient was seen yesterday, and she would like her to receive follow up within the next 7 days. Patient is uninsured and does not have a PCP. ED CM contacted the [facility] to see if they can accommodate patient, state they will reach out to patient for scheduling. Updated Dr. Pecola Leisureeese. No further ED CM needs identified

## 2017-04-03 ENCOUNTER — Encounter: Payer: Self-pay | Admitting: Neurology

## 2017-04-03 ENCOUNTER — Ambulatory Visit (INDEPENDENT_AMBULATORY_CARE_PROVIDER_SITE_OTHER): Payer: Self-pay | Admitting: Neurology

## 2017-04-03 DIAGNOSIS — G932 Benign intracranial hypertension: Secondary | ICD-10-CM

## 2017-04-03 HISTORY — DX: Benign intracranial hypertension: G93.2

## 2017-04-03 MED ORDER — ACETAZOLAMIDE 250 MG PO TABS: 500.0000 mg | ORAL_TABLET | Freq: Two times a day (BID) | ORAL | 1 refills | Status: DC

## 2017-04-03 NOTE — Progress Notes (Signed)
Reason for visit: Papilledema  Referring physician: Dr. Jonny RuizLe  Savannah [name] is a 31 y.o. female  History of present illness:  [name] is a 31 year old right-handed Hispanic female with a two-week history of headaches. The patient went in for a routine eye examination and saw Dr. Conley RollsLe, who noted bilateral papilledema. The patient had noted headaches only for 2 weeks prior, the headaches are bifrontal in nature and are associated with neck stiffness. The patient may have some ringing in the ears that comes and goes and some pain in the ears. She went to the emergency room and underwent a CT scan of the brain that was normal, MRV of the head was also done and did not show definite venous sinus thrombosis. The patient has undergone lumbar puncture that was relatively unremarkable, spinal fluid protein level was normal. The patient was placed on Diamox at 500 mg twice daily. The patient has had some numbness and tingling in the hands and feet on the medication. The patient is still currently having headaches, but she only went on the medication on 03/31/2017. The patient reports no weakness of the extremities, no change in balance, no problems controlling the bowels or the bladder. She uses an IUD for birth control, this has been in place since 2016. The patient is sent to this office for further management.  Past Medical History:  Diagnosis Date  . Anxiety   . Pseudotumor cerebri 04/03/2017    Past Surgical History:  Procedure Laterality Date  . NO PAST SURGERIES      Family History  Problem Relation Age of Onset  . Diabetes Mother   . Diabetes Father   . High blood pressure Father     Social history:  reports that she has never smoked. She has never used smokeless tobacco. She reports that she does not drink alcohol or use drugs.  Medications:  Prior to Admission medications   Medication Sig Start Date End Date Taking? Authorizing Provider  acetaZOLAMIDE (DIAMOX) 250  MG tablet Take 2 tablets (500 mg total) by mouth 2 (two) times daily. 04/01/17  Yes Tilden Fossaees, Elizabeth, MD  levonorgestrel [facility](MIRENA) 20 MCG/24HR IUD 1 each by Intrauterine route once. Implanted November 2017   Yes [provider]  Multiple Vitamin (MULTIVITAMIN WITH MINERALS) TABS tablet Take 1 tablet by mouth daily.   Yes [provider]     No Known Allergies  ROS:  Out of a complete 14 system review of symptoms, the patient complains only of the following symptoms, and all other reviewed systems are negative.  Headache  Blood pressure 119/82, pulse 66, height 5\' 5"  (1.651 m), weight 177 lb (80.3 kg), unknown if currently breastfeeding.  Physical Exam  General: The patient is alert and cooperative at the time of the examination.  Eyes: Pupils are equal, round, and reactive to light. Disc margins are slightly blurred bilaterally. No hemorrhages are noted.  Neck: The neck is supple, no carotid bruits are noted.  Respiratory: The respiratory examination is clear.  Cardiovascular: The cardiovascular examination reveals a regular rate and rhythm, no obvious murmurs or rubs are noted.  Skin: Extremities are without significant edema.  Neurologic Exam  Mental status: The patient is alert and oriented x 3 at the time of the examination. The patient has apparent normal recent and remote memory, with an apparently normal attention span and concentration ability.  Cranial nerves: Facial symmetry is present. There is good sensation of the face to pinprick and soft touch  bilaterally. The strength of the facial muscles and the muscles to head turning and shoulder shrug are normal bilaterally. Speech is well enunciated, no aphasia or dysarthria is noted. Extraocular movements are full. Visual fields are full. The tongue is midline, and the patient has symmetric elevation of the soft palate. No obvious hearing deficits are noted.  Motor: The motor testing reveals 5 over 5 strength  of all 4 extremities. Good symmetric motor tone is noted throughout.  Sensory: Sensory testing is intact to pinprick, soft touch, vibration sensation, and position sense on all 4 extremities. No evidence of extinction is noted.  Coordination: Cerebellar testing reveals good finger-nose-finger and heel-to-shin bilaterally.  Gait and station: Gait is normal. Tandem gait is normal. Romberg is negative. No drift is seen.  Reflexes: Deep tendon reflexes are symmetric and normal bilaterally. Toes are downgoing bilaterally.   Assessment/Plan:  1. Pseudotumor cerebri  The patient is not markedly overweight. The patient will remain on Diamox for now, she will follow-up in about 3 months. She will call if her headaches worsen, a repeat spinal tap may be done. The patient was given a prescription for Diamox.  Marlan Palau MD 04/03/2017 11:52 AM  [facility] [address] [place], [place] [address]  Phone [phone] Fax [phone]

## 2017-04-04 LAB — CSF CULTURE W GRAM STAIN

## 2017-04-04 LAB — CSF CULTURE: CULTURE: NO GROWTH

## 2017-04-22 ENCOUNTER — Ambulatory Visit (INDEPENDENT_AMBULATORY_CARE_PROVIDER_SITE_OTHER): Payer: Medicaid Other | Admitting: Physician Assistant

## 2017-04-22 ENCOUNTER — Encounter (INDEPENDENT_AMBULATORY_CARE_PROVIDER_SITE_OTHER): Payer: Self-pay | Admitting: Physician Assistant

## 2017-04-22 VITALS — BP 121/72 | HR 60 | Temp 97.9°F | Ht 64.5 in | Wt 175.4 lb

## 2017-04-22 DIAGNOSIS — H4710 Unspecified papilledema: Secondary | ICD-10-CM

## 2017-04-22 MED ORDER — ACETAZOLAMIDE 250 MG PO TABS: 500.0000 mg | ORAL_TABLET | Freq: Two times a day (BID) | ORAL | 5 refills | Status: DC

## 2017-04-22 NOTE — Patient Instructions (Signed)
Idiopathic Intracranial Hypertension Idiopathic intracranial hypertension (IIH) is a condition that increases pressure around the brain. The fluid that surrounds the brain and spinal cord (cerebrospinal fluid, CSF) increases and causes the pressure. Idiopathic means that the cause of this condition is not known. IIH affects the brain and spinal cord (is a neurological disorder). If this condition is not treated, it can cause vision loss or blindness. What increases the risk? You are more likely to develop this condition if:  You are severely overweight (obese).  You are a woman who has not gone through menopause.  You take certain medicines, such as birth control or steroids.  What are the signs or symptoms? Symptoms of IIH include:  Headaches. This is the most common symptom.  Pain in the shoulders or neck.  Nausea and vomiting.  A "rushing water" or pulsing sound within the ears (pulsatile tinnitus).  Double vision.  Blurred vision.  Brief episodes of complete vision loss.  How is this diagnosed? This condition may be diagnosed based on:  Your symptoms.  Your medical history.  CT scan of the brain.  MRI of the brain.  Magnetic resonance venogram (MRV) to check veins in the brain.  Diagnostic lumbar puncture. This is a procedure to remove and examine a sample of cerebrospinal fluid. This procedure can determine whether too much fluid may be causing IIH.  A thorough eye exam to check for swelling or nerve damage in the eyes.  How is this treated? Treatment for this condition depends on your symptoms. The goal of treatment is to decrease the pressure around your brain. Common treatments include:  Medicines to decrease the production of spinal fluid and lower the pressure within your skull.  Medicines to prevent or treat headaches.  Surgery to place drains (shunts) in your brain to remove excess fluid.  Lumbar puncture to remove excess cerebrospinal  fluid.  Follow these instructions at home:  If you are overweight or obese, work with your health care provider to lose weight.  Take over-the-counter and prescription medicines only as told by your health care provider.  Do not drive or use heavy machinery while taking medicines that can make you sleepy.  Keep all follow-up visits as told by your health care provider. This is important. Contact a health care provider if:  You have changes in your vision, such as: ? Double vision. ? Not being able to see colors (color vision). Get help right away if:  You have any of the following symptoms and they get worse or do not get better. ? Headaches. ? Nausea. ? Vomiting. ? Vision changes or difficulty seeing. Summary  Idiopathic intracranial hypertension (IIH) is a condition that increases pressure around the brain. The cause is not known (is idiopathic).  The most common symptom of IIH is headaches.  Treatment may include medicines or surgery to relieve the pressure on your brain. This information is not intended to replace advice given to you by your health care provider. Make sure you discuss any questions you have with your health care provider. Document Released: 01/06/2002 Document Revised: 09/18/2016 Document Reviewed: 09/18/2016 Elsevier Interactive Patient Education  2017 [employer]  

## 2017-04-22 NOTE — Progress Notes (Signed)
Subjective:  Patient ID: Savannah [name], female    DOB: [birth year]  Age: 31 y.o. MRN: [id number]  CC: papilledema  HPI Savannah [name] is a 31 y.o. female with a PMH of anxiety presents on follow up of papilledema. ED visit on 03/31/17 after being told by ophthalmology to go to ED for papilledema. Ultimately had a LP done which revealed opening pressure of 40. MRI revealed atypical venous antomy without convincing evidence of acute dural venous sinus thrombosis. CT head  Prescribed Diamox 500 mg BID. Has recently been to neurology and was told to continue on Diamox. She will return for a neurology f/u visit in August. Denies headache, visual disturbances, nausea, vomiting, stiff neck, fever, chills, CP, SOB, abdominal pain, or GI/GU sxs.     Outpatient Medications Prior to Visit  Medication Sig Dispense Refill  . acetaZOLAMIDE (DIAMOX) 250 MG tablet Take 2 tablets (500 mg total) by mouth 2 (two) times daily. 180 tablet 1  . levonorgestrel (MIRENA) 20 MCG/24HR IUD 1 each by Intrauterine route once. Implanted November 2017    . Multiple Vitamin (MULTIVITAMIN WITH MINERALS) TABS tablet Take 1 tablet by mouth daily.     No facility-administered medications prior to visit.      ROS Review of Systems  Constitutional: Negative for chills, fever and malaise/fatigue.  Eyes: Negative for blurred vision.  Respiratory: Negative for shortness of breath.   Cardiovascular: Negative for chest pain and palpitations.  Gastrointestinal: Negative for abdominal pain and nausea.  Genitourinary: Negative for dysuria and hematuria.  Musculoskeletal: Negative for joint pain and myalgias.  Skin: Negative for rash.  Neurological: Negative for tingling and headaches (not currently).  Psychiatric/Behavioral: Negative for depression. The patient is not nervous/anxious.     Objective:  BP 121/72 (BP Location: Left Arm, Patient Position: Sitting, Cuff Size: Normal)   Pulse 60   Temp 97.9 F  (36.6 C) (Oral)   Ht 5' 4.5" (1.638 m)   Wt 175 lb 6.4 oz (79.6 kg)   LMP  (Exact Date)   SpO2 98%   Breastfeeding? No   BMI 29.64 kg/m   BP/Weight 04/22/2017 04/03/2017 04/01/2017  Systolic BP 121 119 115  Diastolic BP 72 82 78  Wt. (Lbs) 175.4 177 -  BMI 29.64 29.45 -      Physical Exam  Constitutional: She is oriented to person, place, and time.  Well developed, well nourished, NAD, polite  HENT:  Head: Normocephalic and atraumatic.  Eyes: Conjunctivae and EOM are normal. Pupils are equal, round, and reactive to light. No scleral icterus.  Neck: Normal range of motion. Neck supple.  Cardiovascular: Normal rate, regular rhythm and normal heart sounds.   No carotid bruits  Pulmonary/Chest: Effort normal and breath sounds normal.  Musculoskeletal: She exhibits no edema.  Neurological: She is alert and oriented to person, place, and time. She has normal reflexes. No cranial nerve deficit. Coordination normal.  Strength 5/5 throughout  Skin: Skin is warm and dry. No rash noted. No erythema. No pallor.  Psychiatric: She has a normal mood and affect. Her behavior is normal. Thought content normal.  Vitals reviewed.    Assessment & Plan:   1. Papilledema - Refill acetaZOLAMIDE (DIAMOX) 250 MG tablet; Take 2 tablets (500 mg total) by mouth 2 (two) times daily.  Dispense: 120 tablet; Refill: 5 - MR MRV HEAD W CONTRAST; Future. 2/2 abnormal venous anatomy. - Keep f/u appointment with neurology - Return to your ophthalmologist - Lipid Panel; Future - TSH; Future  Meds ordered this encounter  Medications  . acetaZOLAMIDE (DIAMOX) 250 MG tablet    Sig: Take 2 tablets (500 mg total) by mouth 2 (two) times daily.    Dispense:  120 tablet    Refill:  5    Order Specific Question:   Supervising Provider    Answer:   Quentin AngstJEGEDE, OLUGBEMIGA E [id number]]    Follow-up: Return in about 4 weeks (around 05/20/2017) for Papilledema.   Loletta Specteroger David Aalia Greulich PA

## 2017-04-29 ENCOUNTER — Other Ambulatory Visit (INDEPENDENT_AMBULATORY_CARE_PROVIDER_SITE_OTHER): Payer: Self-pay

## 2017-04-29 DIAGNOSIS — H4710 Unspecified papilledema: Secondary | ICD-10-CM

## 2017-04-30 LAB — TSH: TSH: 2.03 u[IU]/mL (ref 0.450–4.500)

## 2017-04-30 LAB — LIPID PANEL
Chol/HDL Ratio: 3.1 ratio (ref 0.0–4.4)
Cholesterol, Total: 115 mg/dL (ref 100–199)
HDL: 37 mg/dL — AB (ref >39)
LDL Calculated: 56 mg/dL (ref 0–99)
TRIGLYCERIDES: 109 mg/dL (ref 0–149)
VLDL Cholesterol Cal: 22 mg/dL (ref 5–40)

## 2017-05-07 ENCOUNTER — Ambulatory Visit (HOSPITAL_COMMUNITY)
Admission: RE | Admit: 2017-05-07 | Discharge: 2017-05-07 | Disposition: A | Discharge status: Home / Self Care | Payer: Self-pay | Source: Ambulatory Visit | Attending: Physician Assistant | Admitting: Physician Assistant

## 2017-05-07 DIAGNOSIS — H4710 Unspecified papilledema: Secondary | ICD-10-CM | POA: Insufficient documentation

## 2017-05-22 ENCOUNTER — Encounter (INDEPENDENT_AMBULATORY_CARE_PROVIDER_SITE_OTHER): Payer: Self-pay | Admitting: Physician Assistant

## 2017-05-22 ENCOUNTER — Ambulatory Visit (INDEPENDENT_AMBULATORY_CARE_PROVIDER_SITE_OTHER): Payer: Self-pay | Admitting: Physician Assistant

## 2017-05-22 VITALS — BP 112/69 | HR 54 | Temp 97.8°F | Wt 173.4 lb

## 2017-05-22 DIAGNOSIS — G932 Benign intracranial hypertension: Secondary | ICD-10-CM

## 2017-05-22 DIAGNOSIS — H4710 Unspecified papilledema: Secondary | ICD-10-CM

## 2017-05-22 NOTE — Progress Notes (Signed)
Subjective:  Patient ID: Savannah Savannah [name], female    DOB: 09/18/1986  Age: 31 y.o. MRN: [id number]  CC: f/u MRV, papilledema and Pseudomotor cerebri   HPI  Savannah [name] is a 31 y.o. female with a PMH of anxiety, papilledema, and pseudomotor cerebri presents on follow up of papilledema and pseudomotor cerebri. ED visit on 03/31/17 after being told by ophthalmology to go to ED for papilledema. Ultimately had a LP done which revealed opening pressure of 40. MRI revealed atypical venous antomy without convincing evidence of acute dural venous sinus thrombosis. 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 Prescribed Diamox 500 mg BID. She will return for a neurology f/u visit on June 25, 2017. Reports side effects with Diamox including bilateral elbow pain and altered taste sensation. Has not returned to her ophthalmologist yet for f/u of papilledema. Says her ophthalmologist sees patients on a walk in basis. Denies headache, visual disturbances, nausea, vomiting, stiff neck, fever, chills, CP, SOB, abdominal pain, or GI/GU sxs.    Outpatient Medications Prior to Visit  Medication Sig Dispense Refill  . acetaZOLAMIDE (DIAMOX) 250 MG tablet Take 2 tablets (500 mg total) by mouth 2 (two) times daily. 120 tablet 5  . levonorgestrel (MIRENA) 20 MCG/24HR IUD 1 each by Intrauterine route once. Implanted November 2017    . Multiple Vitamin (MULTIVITAMIN WITH MINERALS) TABS tablet Take 1 tablet by mouth daily.     No facility-administered medications prior to visit.      ROS Review of Systems  Constitutional: Negative for chills, fever and malaise/fatigue.  Eyes: Negative for blurred vision.  Respiratory: Negative for shortness of breath.   Cardiovascular: Negative for chest pain and palpitations.  Gastrointestinal: Negative for abdominal pain and nausea.   Genitourinary: Negative for dysuria and hematuria.  Musculoskeletal: Positive for joint pain. Negative for myalgias.  Skin: Negative for rash.  Neurological: Negative for tingling and headaches.       Altered taste  Psychiatric/Behavioral: Negative for depression. The patient is not nervous/anxious.     Objective:  BP 112/69 (BP Location: Left Arm, Patient Position: Sitting, Cuff Size: Normal)   Pulse (!) 54   Temp 97.8 F (36.6 C) (Oral)   Wt 173 lb 6.4 oz (78.7 kg)   SpO2 98%   BMI 29.30 kg/m   BP/Weight 05/22/2017 04/22/2017 04/03/2017  Systolic BP 112 121 119  Diastolic BP 69 72 82  Wt. (Lbs) 173.4 175.4 177  BMI 29.3 29.64 29.45      Physical Exam  Constitutional: She is oriented to person, place, and time.  Well developed, overweight, NAD, polite  HENT:  Head: Normocephalic and atraumatic.  Eyes: Pupils are equal, round, and reactive to light. Conjunctivae are normal. No scleral icterus.  Cardiovascular: Normal rate, regular rhythm and normal heart sounds.   No carotid bruit  Pulmonary/Chest: Effort normal and breath sounds normal.  Musculoskeletal: She exhibits no edema.  Neurological: She is alert and oriented to person, place, and time. No cranial nerve deficit. Coordination normal.  Skin: Skin is warm and dry. No rash noted. No erythema. No pallor.  Psychiatric: She has a normal mood and affect. Her behavior is normal. Thought content normal.  Vitals reviewed.    Assessment & Plan:   1. Pseudotumor cerebri - Comprehensive metabolic panel - Advised to keep appointment with [facility] on 06/25/17. - Continue taking Diamox as directed.  2. Papilledema -  Comprehensive metabolic panel - Ambulatory referral to Ophthalmology    Follow-up: Return in about 2 months (around 07/23/2017).   Loletta Specter PA

## 2017-05-22 NOTE — Patient Instructions (Signed)
Control de la presin intracraneal (Intracranial Pressure Monitoring) La presin intracraneal es la presin en el crneo. El control de la presin intracraneal se realiza despus de una lesin en el cerebro que provoca hinchazn. Le permite al mdico medir y observar la presin y los cambios de presin dentro del crneo. Se [place] como una gua para el [place]tratamiento. CMO SE CONTROLA LA PRESIN INTRACRANEAL? Existen tres formas principales para Scientist, physiologicalcontrolar la presin intracraneal:  Mediante la colocacin de un tubo delgado y flexible (catter) a travs de una perforacin en uno de los espacios llenos de lquido (ventrculos) del cerebro.  Mediante la colocacin de un tornillo o perno hueco en el espacio entre el cerebro, sus membranas y el crneo.  Mediante la colocacin de un pequeo sensor de presin a travs de una perforacin en el crneo. HAY ALGN RIESGO ASOCIADO CON EL CONTROL DE LA PRESIN INTRACRANEAL? Por lo general, el control de la presin intracraneal es un procedimiento seguro. Sin embargo, Tree surgeoncomo en cualquier procedimiento, pueden surgir complicaciones. Las complicaciones posibles son:  Infeccin.  Fallas en el procedimiento.  Hemorragias.  Lesin en el tejido cerebral con problemas neurolgicos permanentes. QU SUCEDE SI LA PRESIN INTRACRANEAL ES ALTA? Un aumento de la presin intracraneal puede ser peligroso. Si la presin es alta, el cerebro posiblemente no funcione tan bien como antes de la lesin. Si se eleva ms que la presin arterial, el cerebro dejar de recibir Tajikistansangre y es posible que se produzca muerte cerebral. Para prevenir que esto suceda, si la presin intracraneal es elevada, se drenar el lquido en el cerebro, llamado lquido cefalorraqudeo. El lquido se puede Medical laboratory scientific officerdrenar mientras se Geographical information systems officerrealiza el control, en el caso de que se use el mtodo con el catter, el tornillo o el perno. Esta informacin no tiene Theme park managercomo fin reemplazar el consejo del mdico. Asegrese de hacerle al  mdico cualquier pregunta que tenga. Document Released: 08/18/2013 Document Revised: 08/18/2013 Document Reviewed: 07/05/2013 Elsevier Interactive Patient Education  2017 [employer]

## 2017-05-23 LAB — COMPREHENSIVE METABOLIC PANEL
ALBUMIN: 4.6 g/dL (ref 3.5–5.5)
ALT: 37 IU/L — ABNORMAL HIGH (ref 0–32)
AST: 18 IU/L (ref 0–40)
Albumin/Globulin Ratio: 1.5 (ref 1.2–2.2)
Alkaline Phosphatase: 110 IU/L (ref 39–117)
BUN / CREAT RATIO: 16 (ref 9–23)
BUN: 16 mg/dL (ref 6–20)
Bilirubin Total: 0.5 mg/dL (ref 0.0–1.2)
CALCIUM: 9.5 mg/dL (ref 8.7–10.2)
CO2: 16 mmol/L — ABNORMAL LOW (ref 20–29)
Chloride: 109 mmol/L — ABNORMAL HIGH (ref 96–106)
Creatinine, Ser: 0.98 mg/dL (ref 0.57–1.00)
GFR calc Af Amer: 88 mL/min/{1.73_m2} (ref >59)
GFR, EST NON AFRICAN AMERICAN: 76 mL/min/{1.73_m2} (ref >59)
GLOBULIN, TOTAL: 3 g/dL (ref 1.5–4.5)
Glucose: 81 mg/dL (ref 65–99)
Potassium: 3.6 mmol/L (ref 3.5–5.2)
SODIUM: 143 mmol/L (ref 134–144)
TOTAL PROTEIN: 7.6 g/dL (ref 6.0–8.5)

## 2017-06-06 ENCOUNTER — Encounter (INDEPENDENT_AMBULATORY_CARE_PROVIDER_SITE_OTHER): Payer: Self-pay

## 2017-06-25 ENCOUNTER — Encounter: Payer: Self-pay | Admitting: Adult Health

## 2017-06-25 ENCOUNTER — Ambulatory Visit (INDEPENDENT_AMBULATORY_CARE_PROVIDER_SITE_OTHER): Payer: Self-pay | Admitting: Adult Health

## 2017-06-25 VITALS — BP 112/74 | HR 63 | Ht 64.5 in | Wt 174.2 lb

## 2017-06-25 DIAGNOSIS — G932 Benign intracranial hypertension: Secondary | ICD-10-CM

## 2017-06-25 NOTE — Progress Notes (Signed)
PATIENT: Savannah [name] DOB: 1986/03/22  REASON FOR VISIT: follow up-pseudotumor cerebri HISTORY FROM: patient and interpreter  HISTORY OF PRESENT ILLNESS: Today 06/25/17 Savannah [name] is a 31 year old female with a history of pseudotumor cerebri. She returns today for follow-up. The patient does not speak [place]. An interpreter is present. The patient continues on Diamox 500 mg twice a day. She states that essentially her headaches have resolved. She reports occasionally she will get a headache if her children are loud however she states that if she goes into a quiet room her headache typically resolves within 30 minutes. She denies any visual disturbances including loss of vision and double vision. Denies any back pain. Denies tinnitus. She reports initially she had trouble tolerating Diamox however she is tolerating it fine now. She reports that she did see her ophthalmologist in July and she reports that she was told that the papilledema was mild. I have not seen this report. She returns today for an evaluation.  HISTORY 04/03/17: Savannah [name] is a 31 year old right-handed Hispanic female with a two-week history of headaches. The patient went in for a routine eye examination and saw Dr. Conley Rolls, who noted bilateral papilledema. The patient had noted headaches only for 2 weeks prior, the headaches are bifrontal in nature and are associated with neck stiffness. The patient may have some ringing in the ears that comes and goes and some pain in the ears. She went to the emergency room and underwent a CT scan of the brain that was normal, MRV of the head was also done and did not show definite venous sinus thrombosis. The patient has undergone lumbar puncture that was relatively unremarkable, spinal fluid protein level was normal. The patient was placed on Diamox at 500 mg twice daily. The patient has had some numbness and tingling in the hands and feet on the medication. The patient is still  currently having headaches, but she only went on the medication on 03/31/2017. The patient reports no weakness of the extremities, no change in balance, no problems controlling the bowels or the bladder. She uses an IUD for birth control, this has been in place since 2016. The patient is sent to this office for further management.  REVIEW OF SYSTEMS: Out of a complete 14 system review of symptoms, the patient complains only of the following symptoms, and all other reviewed systems are negative.  Fatigue, eye itching, eye redness, eye pain, swollen abdomen, abdominal pain, restless leg, insomnia, daytime sleepiness, frequency of urination, joint pain, headache, numbness, depression, bruise/bleed easily  ALLERGIES: No Known Allergies  HOME MEDICATIONS: Outpatient Medications Prior to Visit  Medication Sig Dispense Refill  . acetaZOLAMIDE (DIAMOX) 250 MG tablet Take 2 tablets (500 mg total) by mouth 2 (two) times daily. 120 tablet 5  . levonorgestrel (MIRENA) 20 MCG/24HR IUD 1 each by Intrauterine route once. Implanted November 2017    . Multiple Vitamin (MULTIVITAMIN WITH MINERALS) TABS tablet Take 1 tablet by mouth daily.     No facility-administered medications prior to visit.     PAST MEDICAL HISTORY: Past Medical History:  Diagnosis Date  . Anxiety   . Pseudotumor cerebri 04/03/2017    PAST SURGICAL HISTORY: Past Surgical History:  Procedure Laterality Date  . NO PAST SURGERIES      FAMILY HISTORY: Family History  Problem Relation Age of Onset  . Diabetes Mother   . Diabetes Father   . High blood pressure Father     SOCIAL HISTORY: Social History  Social History  . Marital status: Single    Spouse name: N/A  . Number of children: 3  . Years of education: 12   Occupational History  . Not on file.   Social History Main Topics  . Smoking status: Never Smoker  . Smokeless tobacco: Never Used  . Alcohol use No  . Drug use: No  . Sexual activity: Not Currently    Other Topics Concern  . Not on file   Social History Narrative   Lives with boyfriend and children   Caffeine use: Drinks coffee and tea daily   Soda rare   Right handed      PHYSICAL EXAM  Vitals:   06/25/17 1304  Weight: 174 lb 3.2 oz (79 kg)  Height: 5' 4.5" (1.638 m)   Body mass index is 29.44 kg/m.  Generalized: Well developed, in no acute distress   Neurological examination  Mentation: Alert oriented to time, place, history taking. Follows all commands speech and language fluent Cranial nerve II-XII: Pupils were equal round reactive to light. Extraocular movements were full, visual field were full on confrontational test. Facial sensation and strength were normal. Uvula tongue midline. Head turning and shoulder shrug  were normal and symmetric. Motor: The motor testing reveals 5 over 5 strength of all 4 extremities. Good symmetric motor tone is noted throughout.  Sensory: Sensory testing is intact to soft touch on all 4 extremities. No evidence of extinction is noted.  Coordination: Cerebellar testing reveals good finger-nose-finger and heel-to-shin bilaterally.  Gait and station: Gait is normal.  Reflexes: Deep tendon reflexes are symmetric and normal bilaterally.   DIAGNOSTIC DATA (LABS, IMAGING, TESTING) - I reviewed patient records, labs, notes, testing and imaging myself where available.  Lab Results  Component Value Date   WBC 7.6 03/31/2017   HGB 14.3 03/31/2017   HCT 41.6 03/31/2017   MCV 90.4 03/31/2017   PLT 235 03/31/2017      Component Value Date/Time   NA 143 05/22/2017 0939   K 3.6 05/22/2017 0939   CL 109 (H) 05/22/2017 0939   CO2 16 (L) 05/22/2017 0939   GLUCOSE 81 05/22/2017 0939   GLUCOSE 101 (H) 03/31/2017 1829   BUN 16 05/22/2017 0939   CREATININE 0.98 05/22/2017 0939   CALCIUM 9.5 05/22/2017 0939   PROT 7.6 05/22/2017 0939   ALBUMIN 4.6 05/22/2017 0939   AST 18 05/22/2017 0939   ALT 37 (H) 05/22/2017 0939   ALKPHOS 110  05/22/2017 0939   BILITOT 0.5 05/22/2017 0939   GFRNONAA 76 05/22/2017 0939   GFRAA 88 05/22/2017 0939   Lab Results  Component Value Date   CHOL 115 04/29/2017   HDL 37 (L) 04/29/2017   LDLCALC 56 04/29/2017   TRIG 109 04/29/2017   CHOLHDL 3.1 04/29/2017    Lab Results  Component Value Date   TSH 2.030 04/29/2017      ASSESSMENT AND PLAN 31 y.o. year old female  has a past medical history of Anxiety and Pseudotumor cerebri (04/03/2017). here with:  1. Pseudotumor cerebri  Overall the patient is doing well. She is no longer having any symptoms. She will continue on Diamox 500 mg twice a day. I have advised patient that if her headache frequency increases or she develops any visual disturbances she should let us know. I did advise that if she has any sudden visual loss she should to the emergency room. She will follow-up in 6 months or sooner if needed.  I spent 15 minutes  with the patient. 50% of this time was spent discussing her medication and symptoms.   Butch PennyMegan Krishana Lutze, MSN, NP-C 06/25/2017, 1:14 PM [facility] [address], [address] [place], [place] [address] [phone]

## 2017-06-25 NOTE — Patient Instructions (Signed)
Your Plan:  Continue Diamaox 500 mg twice a day If your symptoms worsen or you develop new symptoms please let us know.  If your headaches return or you begin to have visual disturbances please let us know If you have any sudden loss of vision have someone take you to Emergency Room   Thank you for [facility]. I hope we have been able to provide you high quality care today.  You may receive a patient satisfaction survey over the next few weeks. We would appreciate your feedback and comments so that we may continue to improve ourselves and the health of our patients.

## 2017-06-25 NOTE — Progress Notes (Signed)
I have read the note, and I agree with the clinical assessment and plan.  Lejla Moeser KEITH   

## 2017-07-13 ENCOUNTER — Other Ambulatory Visit: Payer: Self-pay | Admitting: Neurology

## 2017-07-13 DIAGNOSIS — H4710 Unspecified papilledema: Secondary | ICD-10-CM

## 2017-07-23 ENCOUNTER — Ambulatory Visit (INDEPENDENT_AMBULATORY_CARE_PROVIDER_SITE_OTHER): Payer: Self-pay | Admitting: Physician Assistant

## 2017-07-23 ENCOUNTER — Encounter (INDEPENDENT_AMBULATORY_CARE_PROVIDER_SITE_OTHER): Payer: Self-pay | Admitting: Physician Assistant

## 2017-07-23 VITALS — BP 124/77 | HR 57 | Temp 98.0°F | Wt 172.2 lb

## 2017-07-23 DIAGNOSIS — H4710 Unspecified papilledema: Secondary | ICD-10-CM

## 2017-07-23 DIAGNOSIS — G932 Benign intracranial hypertension: Secondary | ICD-10-CM

## 2017-07-23 NOTE — Progress Notes (Signed)
   Subjective:  Patient ID: Savannah [name], female    DOB: 02/10/1986  Age: 31 y.o. MRN: [id number]  CC: f/u pseudomotor cerebri  HPI  Rocha-Garciais a 31 y.o.femalewith a PMH of anxiety, papilledema, and pseudomotor cerebri presents on follow up of papilledema and pseudomotor cerebri. She has since been to neurology. Reported tolerating Diamox well and feeling well overall. She is to return to neurology in 6 months or sooner if needed. No headache or visual complaints. Does not endorse any other symptoms or complaints.    Went to Ophthalmology and was told she had mild papilledema but no report is available. She is to return to ophthalmology in November.    Outpatient Medications Prior to Visit  Medication Sig Dispense Refill  . acetaZOLAMIDE (DIAMOX) 250 MG tablet TAKE 2 TABLETS BY MOUTH TWICE DAILY 120 tablet 5  . levonorgestrel (MIRENA) 20 MCG/24HR IUD 1 each by Intrauterine route once. Implanted November 2017    . Multiple Vitamin (MULTIVITAMIN WITH MINERALS) TABS tablet Take 1 tablet by mouth daily.     No facility-administered medications prior to visit.      ROS Review of Systems  Constitutional: Negative for chills, fever and malaise/fatigue.  Eyes: Negative for blurred vision.  Respiratory: Negative for shortness of breath.   Cardiovascular: Negative for chest pain and palpitations.  Gastrointestinal: Negative for abdominal pain and nausea.  Genitourinary: Negative for dysuria and hematuria.  Musculoskeletal: Negative for joint pain and myalgias.  Skin: Negative for rash.  Neurological: Negative for tingling and headaches.  Psychiatric/Behavioral: Negative for depression. The patient is not nervous/anxious.     Objective:  BP 124/77 (BP Location: Left Arm, Patient Position: Sitting, Cuff Size: Normal)   Pulse (!) 57   Temp 98 F (36.7 C) (Oral)   Wt 172 lb 3.2 oz (78.1 kg)   SpO2 99%   BMI 29.10 kg/m   BP/Weight 07/23/2017 06/25/2017 05/22/2017  Systolic  BP 124 409112 112  Diastolic BP 77 74 69  Wt. (Lbs) 172.2 174.2 173.4  BMI 29.1 29.44 29.3      Physical Exam  Constitutional: She is oriented to person, place, and time.  Well developed, well nourished, NAD, polite  HENT:  Head: Normocephalic and atraumatic.  Eyes: No scleral icterus.  Neck: Normal range of motion.  Cardiovascular: Normal rate, regular rhythm and normal heart sounds.   Pulmonary/Chest: Effort normal and breath sounds normal.  Musculoskeletal: She exhibits no edema.  Neurological: She is alert and oriented to person, place, and time. No cranial nerve deficit. Coordination normal.  Skin: Skin is warm and dry. No rash noted. No erythema. No pallor.  Psychiatric: She has a normal mood and affect. Her behavior is normal. Thought content normal.  Vitals reviewed.    Assessment & Plan:    1. Pseudotumor cerebri - Pt well controlled on Diamox 500 mg BID. Keep neurology appointment in 6 months.  2. Papilledema - Currently doing well - Pt to return to ophthalmology on 09/2017.  *Advised patient to have PAP smear done. Said she will go to her regular GYN and return with report.  Follow-up: Return in about 3 months (around 10/22/2017) for pap smear.   Loletta Specteroger David Jamita Mckelvin PA

## 2017-09-25 ENCOUNTER — Telehealth: Payer: Self-pay | Admitting: *Deleted

## 2017-09-25 NOTE — Telephone Encounter (Signed)
Called patient to check on status re: notes received form her eye doctor. Patient stated she did have a headache last week but feels fine now. She continues to take Diamox 250 mg tabs, taking two tabs twice daily. This RN advised if she has headache that will not go away to call this office as she may need medication dose adjustment or need to be seen. Advised she will get a reminder call for her follow up. Patient verbalized understanding of call.

## 2017-12-30 ENCOUNTER — Encounter: Payer: Self-pay | Admitting: Adult Health

## 2017-12-30 ENCOUNTER — Ambulatory Visit: Payer: Self-pay | Admitting: Adult Health

## 2017-12-30 VITALS — BP 129/83 | HR 63 | Ht 64.5 in | Wt 183.5 lb

## 2017-12-30 DIAGNOSIS — G932 Benign intracranial hypertension: Secondary | ICD-10-CM

## 2017-12-30 NOTE — Progress Notes (Signed)
I have read the note, and I agree with the clinical assessment and plan.  Kayin Osment K Zaakirah Kistner   

## 2017-12-30 NOTE — Progress Notes (Signed)
PATIENT: Savannah [name] DOB: 03-18-1986  REASON FOR VISIT: follow up HISTORY FROM: patient  HISTORY OF PRESENT ILLNESS: Today 12/30/17 Ms. [name] is a 32 year old female with a history of pseudotumor cerebra.  She returns today for follow-up.  The patient has an interpreter present.  The patient reports that she is continuing on Diamox 500 mg twice a day.  She reports that she no longer is having headache or visual changes.  She also denies muffled hearing or tinnitus.  She does report that she had a headache last week but reports this is different from the headache she was having before.  The patient did have a follow-up appointment with her ophthalmologist in November.  According to his report from July to November the papilledema remained stable.  Patient has a follow-up appointment in May.  She returns today for an evaluation.  HISTORY 06/25/17 Ms. [name] is a 32 year old female with a history of pseudotumor cerebri. She returns today for follow-up. The patient does not speak [place]. An interpreter is present. The patient continues on Diamox 500 mg twice a day. She states that essentially her headaches have resolved. She reports occasionally she will get a headache if her children are loud however she states that if she goes into a quiet room her headache typically resolves within 30 minutes. She denies any visual disturbances including loss of vision and double vision. Denies any back pain. Denies tinnitus. She reports initially she had trouble tolerating Diamox however she is tolerating it fine now. She reports that she did see her ophthalmologist in July and she reports that she was told that the papilledema was mild. I have not seen this report. She returns today for an evaluation.   REVIEW OF SYSTEMS: Out of a complete 14 system review of symptoms, the patient complains only of the following symptoms, and all other reviewed systems are negative.  See  HPI  ALLERGIES: No Known Allergies  HOME MEDICATIONS: Outpatient Medications Prior to Visit  Medication Sig Dispense Refill  . acetaZOLAMIDE (DIAMOX) 250 MG tablet TAKE 2 TABLETS BY MOUTH TWICE DAILY 120 tablet 5  . levonorgestrel (MIRENA) 20 MCG/24HR IUD 1 each by Intrauterine route once. Implanted November 2017    . Multiple Vitamin (MULTIVITAMIN WITH MINERALS) TABS tablet Take 1 tablet by mouth daily.     No facility-administered medications prior to visit.     PAST MEDICAL HISTORY: Past Medical History:  Diagnosis Date  . Anxiety   . Pseudotumor cerebri 04/03/2017    PAST SURGICAL HISTORY: Past Surgical History:  Procedure Laterality Date  . NO PAST SURGERIES      FAMILY HISTORY: Family History  Problem Relation Age of Onset  . Diabetes Mother   . Diabetes Father   . High blood pressure Father     SOCIAL HISTORY: Social History   Socioeconomic History  . Marital status: Single    Spouse name: Not on file  . Number of children: 3  . Years of education: 46  . Highest education level: Not on file  Social Needs  . Financial resource strain: Not on file  . Food insecurity - worry: Not on file  . Food insecurity - inability: Not on file  . Transportation needs - medical: Not on file  . Transportation needs - non-medical: Not on file  Occupational History  . Not on file  Tobacco Use  . Smoking status: Never Smoker  . Smokeless tobacco: Never Used  Substance and Sexual Activity  . Alcohol  use: No  . Drug use: No  . Sexual activity: Not Currently  Other Topics Concern  . Not on file  Social History Narrative   Lives with boyfriend and children   Caffeine use: Drinks coffee and tea daily   Soda rare   Right handed      PHYSICAL EXAM  Vitals:   12/30/17 0838  Height: 5' 4.5" (1.638 m)   Body mass index is 29.1 kg/m.  Generalized: Well developed, in no acute distress   Neurological examination  Mentation: Alert oriented to time, place, history  taking. Follows all commands speech and language fluent Cranial nerve II-XII: Funduscopic exam not completed on today's visit.  Ophthalmology report was reviewed.  Pupils were equal round reactive to light. Extraocular movements were full, visual field were full on confrontational test. Facial sensation and strength were normal. Uvula tongue midline. Head turning and shoulder shrug  were normal and symmetric. Motor: The motor testing reveals 5 over 5 strength of all 4 extremities. Good symmetric motor tone is noted throughout.  Sensory: Sensory testing is intact to soft touch on all 4 extremities. No evidence of extinction is noted.  Coordination: Cerebellar testing reveals good finger-nose-finger and heel-to-shin bilaterally.  Gait and station: Gait is normal. Tandem gait is normal. Romberg is negative. No drift is seen.  Reflexes: Deep tendon reflexes are symmetric and normal bilaterally.   DIAGNOSTIC DATA (LABS, IMAGING, TESTING) - I reviewed patient records, labs, notes, testing and imaging myself where available.  Lab Results  Component Value Date   WBC 7.6 03/31/2017   HGB 14.3 03/31/2017   HCT 41.6 03/31/2017   MCV 90.4 03/31/2017   PLT 235 03/31/2017      Component Value Date/Time   NA 143 05/22/2017 0939   K 3.6 05/22/2017 0939   CL 109 (H) 05/22/2017 0939   CO2 16 (L) 05/22/2017 0939   GLUCOSE 81 05/22/2017 0939   GLUCOSE 101 (H) 03/31/2017 1829   BUN 16 05/22/2017 0939   CREATININE 0.98 05/22/2017 0939   CALCIUM 9.5 05/22/2017 0939   PROT 7.6 05/22/2017 0939   ALBUMIN 4.6 05/22/2017 0939   AST 18 05/22/2017 0939   ALT 37 (H) 05/22/2017 0939   ALKPHOS 110 05/22/2017 0939   BILITOT 0.5 05/22/2017 0939   GFRNONAA 76 05/22/2017 0939   GFRAA 88 05/22/2017 0939   Lab Results  Component Value Date   CHOL 115 04/29/2017   HDL 37 (L) 04/29/2017   LDLCALC 56 04/29/2017   TRIG 109 04/29/2017   CHOLHDL 3.1 04/29/2017    Lab Results  Component Value Date   TSH 2.030  04/29/2017      ASSESSMENT AND PLAN 32 y.o. year old female  has a past medical history of Anxiety and Pseudotumor cerebri (04/03/2017). here with:  1.  Pseudotumor cerebri  Overall the patient is doing well.  She will continue on Diamox 500 mg twice a day.  I did discuss the patient's ophthalmology report with Dr. Anne HahnWillis.  Because the patient's symptoms have resolved we will continue to monitor.  She does have a follow-up appointment with her ophthalmologist in May.  She is advised that if her headaches return or she begins to have visual changes she should let us know.  She will follow-up in 6 months or sooner if needed.  I spent 15 minutes with the patient. 50% of this time was spent Reviewing signs and symptoms of pseudotumor cerebri.     Butch PennyMegan Jaydan Meidinger, MSN, NP-C 12/30/2017, 8:46 AM  [facility] [address], [address] [place], [place] [address] [phone]   

## 2017-12-30 NOTE — Patient Instructions (Signed)
Your Plan:  Continue Diampx 500 mg twice a day Call if your headaches return- or you have visual changes   [facility]. I hope we have been able to provide you high quality care today.  You may receive a patient satisfaction survey over the next few weeks. We would appreciate your feedback and comments so that we may continue to improve ourselves and the health of our patients.  Hipertensin intracraneal idioptica (Idiopathic Intracranial Hypertension) La hipertensin intracraneal idioptica (HICI) es un trastorno neurolgico que produce un aumento de la presin alrededor del cerebro. Puede provocar prdida de la visin y ceguera si no se trata. FACTORES DE RIESGO La HICI es ms comn en mujeres con mucho sobrepeso (obesas) y en edad frtil. SIGNOS Y SNTOMAS Los sntomas de la HICI incluyen los siguientes:  Dolor de Turkmenistancabeza.  Ganas de vomitar (nuseas).  Vmitos.  Sonido parecido a un "torrente de agua" en los odos (acfeno pulstil).  Visin doble. DIAGNSTICO La hipertensin intracraneal idioptica se diagnostica a travs de distintos exmenes:  Gammagrafas cerebrales, como las siguientes: ? Tomografa computarizada. ? Resonancia magntica. ? Venografa por resonancia magntica (VRM).  Puncin lumbar de diagnstico. Este procedimiento puede determinar si hay demasiado lquido cefalorraqudeo en el sistema nervioso central. Demasiado lquido cefalorraqudeo puede aumentar la presin intracraneal.  Se realizar un examen minucioso de los ojos para detectar hinchazn en el interior. Tambin se realizar una campimetra para detectar si se ha producido algn tipo de dao en los nervios oculares. TRATAMIENTO El tratamiento de la hipertensin intracraneal idioptica depende de los sntomas. Los tratamientos Graybar Electriccomunes incluyen los siguientes:  Puncin lumbar para eliminar el exceso de lquido  cefalorraqudeo.  Medicamentos.  Ciruga. INSTRUCCIONES PARA EL CUIDADO EN EL HOGAR Lo ms importante que se puede hacer para mejorar esta afeccin es adelgazar si hay sobrepeso. SOLICITE ATENCIN MDICA SI:  Nota cambios en la visin.  Tiene visin doble.  Ha perdido la visin cromtica.  SOLICITE ATENCIN MDICA DE INMEDIATO SI:  Los dolores de cabeza empeoran en vez de mejorar.  Las nuseas o los vmitos, o ambas cosas, continan despus del Kemptontratamiento.  La visin no mejora o empeora despus del tratamiento.  ASEGRESE DE QUE:  Comprende estas instrucciones.  Controlar su afeccin.  Recibir ayuda de inmediato si no mejora o si empeora.  Esta informacin no tiene Theme park managercomo fin reemplazar el consejo del mdico. Asegrese de hacerle al mdico cualquier pregunta que tenga. Document Released: 08/07/2005 Document Revised: 11/02/2013 Document Reviewed: 07/05/2013 Elsevier Interactive Patient Education  2017 [employer]

## 2018-02-03 IMAGING — MR MR MRV HEAD W/O CM
2 series · 19 of 48 positions shown · non-contrast
Comparison: 03/31/2017

CLINICAL DATA: Papilledema. Intracranial hypertension. Previous
abnormal MR venography.

EXAM:
MR VENOGRAM the HEAD WITHOUT CONTRAST
TECHNIQUE: Angiographic images of the intracranial venous structures were
obtained using MRV technique without intravenous contrast.

[Series 2: MRV · coronal · 1.5mm · 0.43mm/px · 9 of 141 slices shown]
[im 1/141]
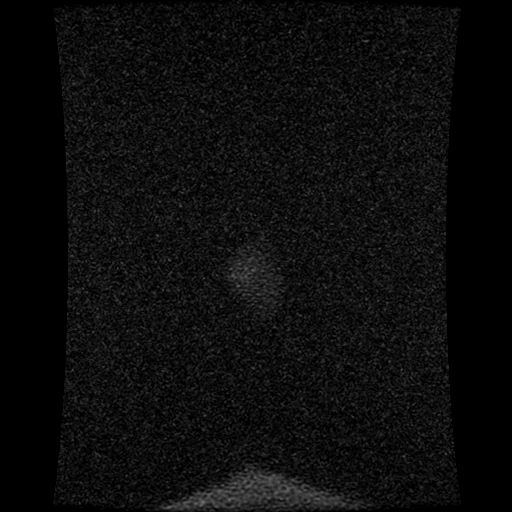
[im 21/141]
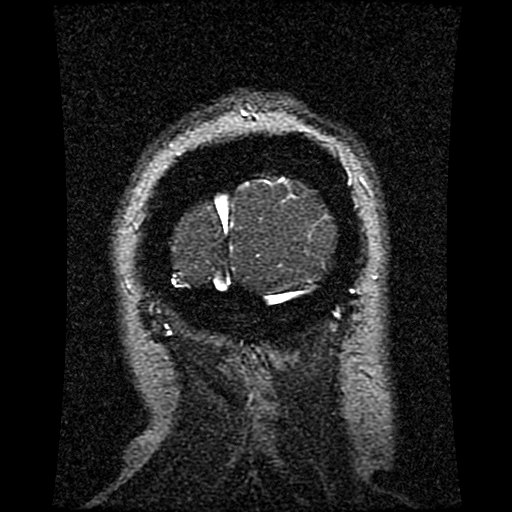
[im 41/141]
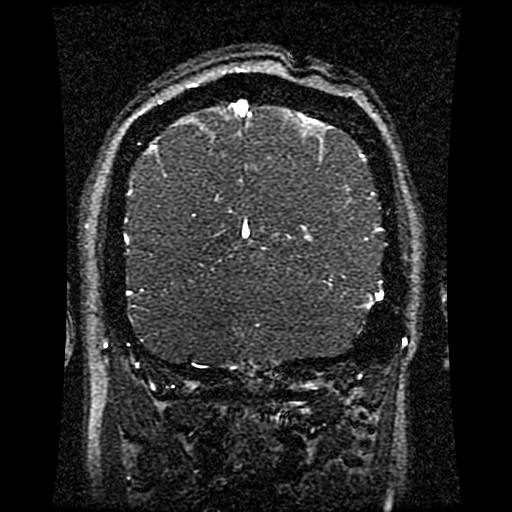
[im 61/141]
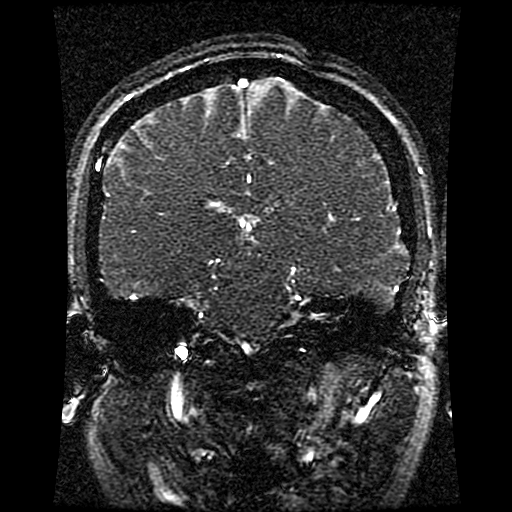
[im 71/141]
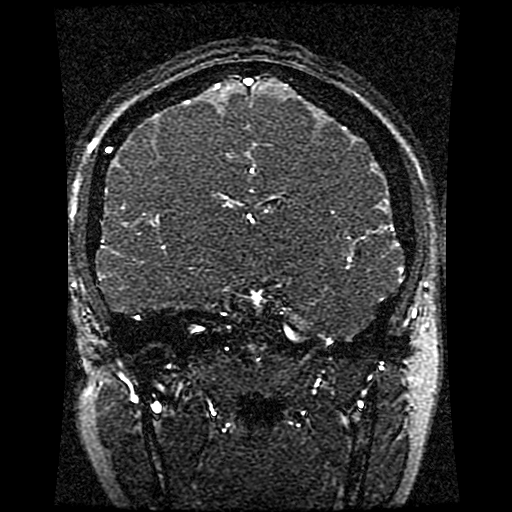
[im 81/141]
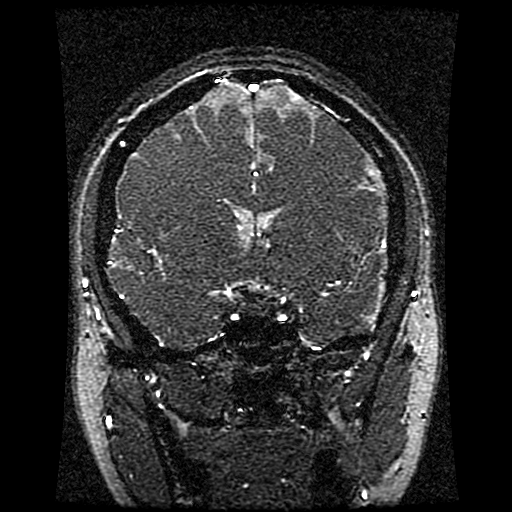
[im 101/141]
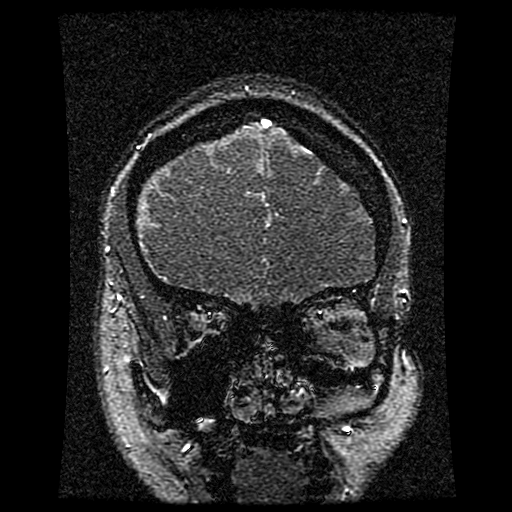
[im 121/141]
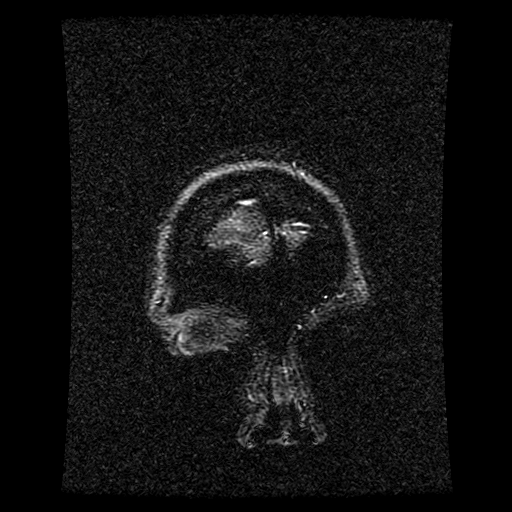
[im 141/141]
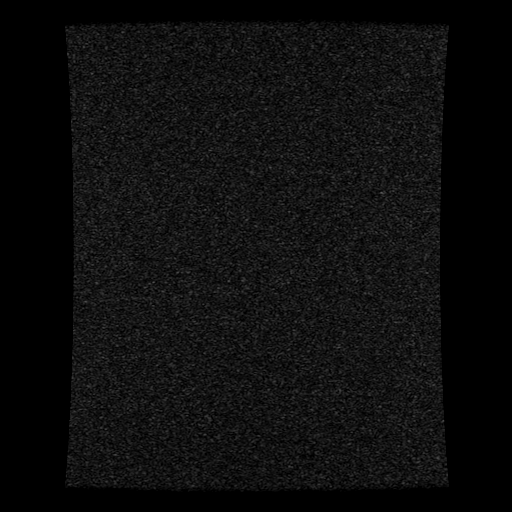

[Series 4: sag inhance (id) · sagittal · 1.8mm · 0.47mm/px · 10 of 319 slices shown]
[im 20/319]
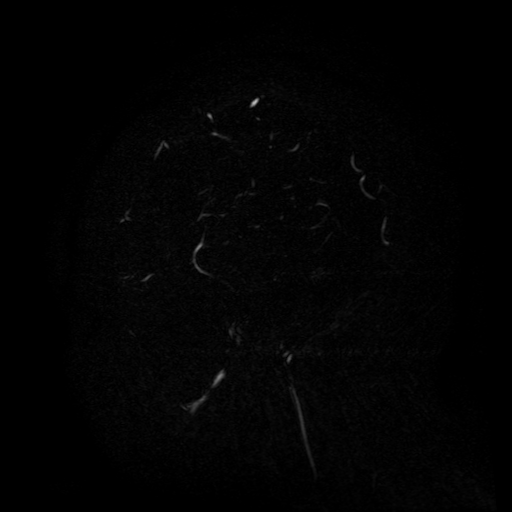
[im 50/319]
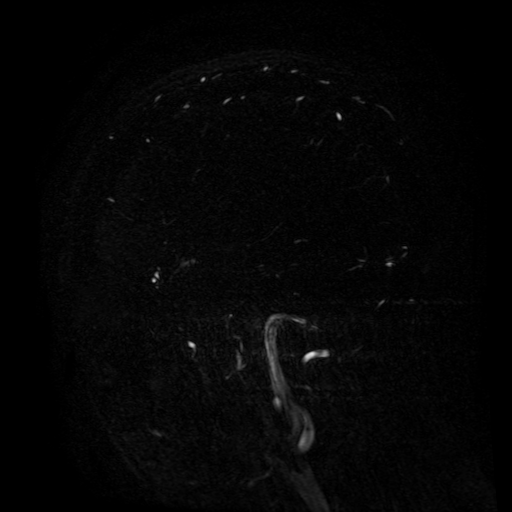
[im 60/319]
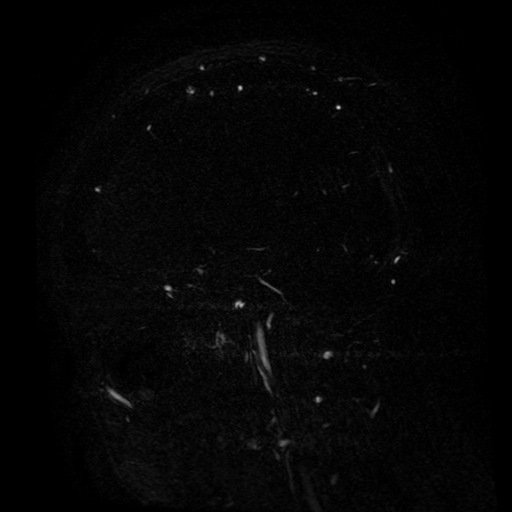
[im 100/319]
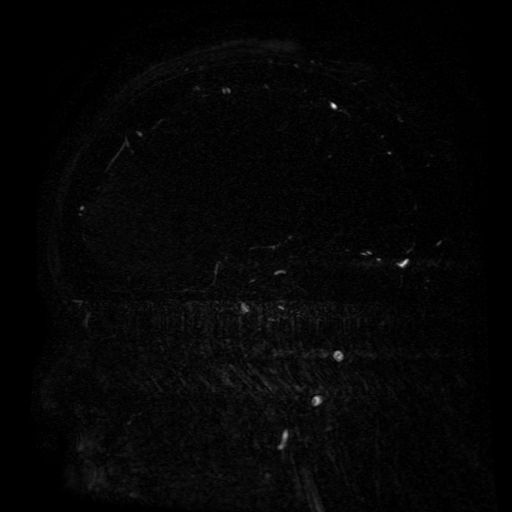
[im 140/319]
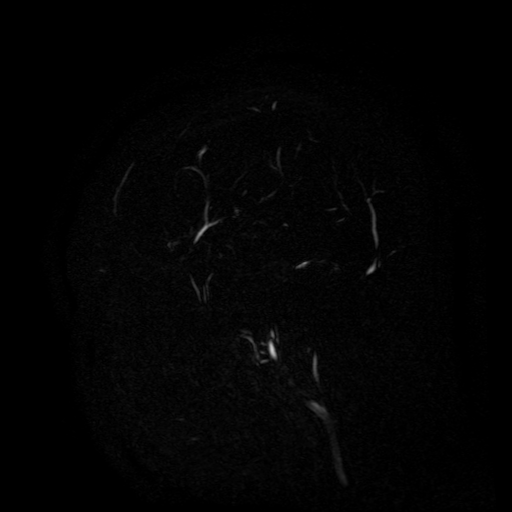
[im 160/319]
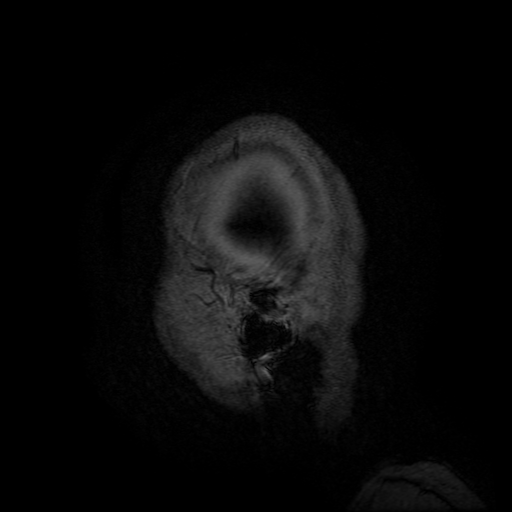
[im 179/319]
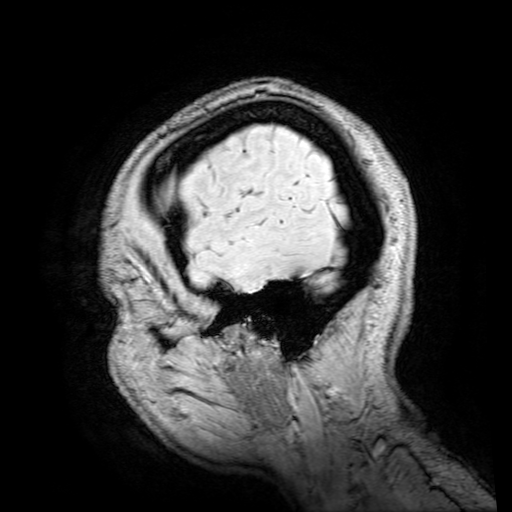
[im 219/319]
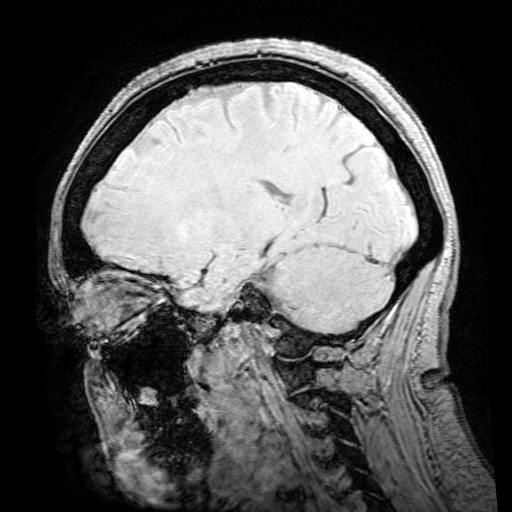
[im 259/319]
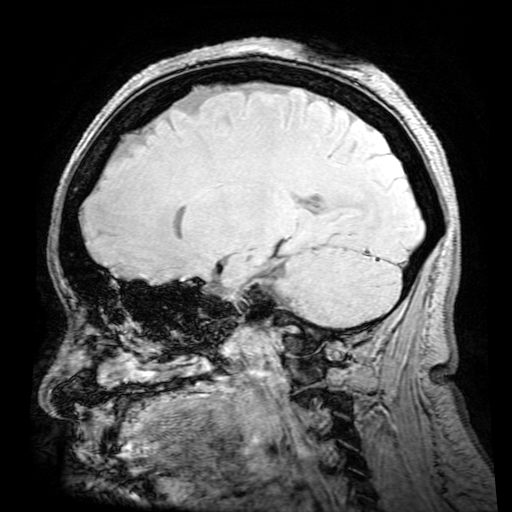
[im 269/319]
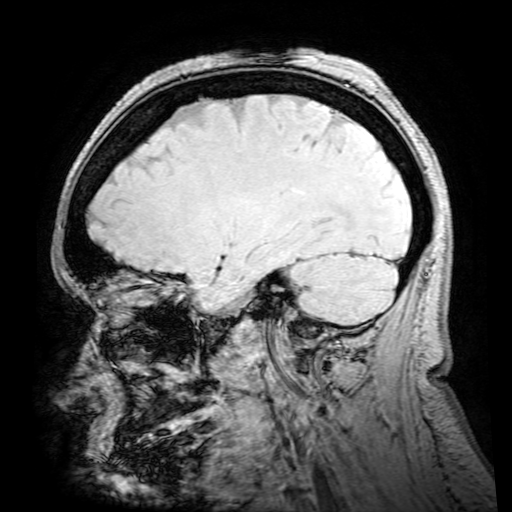

[19 of 48 positions shown; findings below may reference images not displayed]

FINDINGS: No change since the previous study. The patient has congenital
variation in venous anatomy. Superior sagittal sinus continues
inferiorly to drain directly into the right internal jugular vein.
Deep venous system drains in a normal fashion via the left
transverse sinus. No evidence of venous thrombosis or stenosis.
Findings unlikely to be clinically significant.
IMPRESSION: No change. Congenital variant venous anatomy. The aplasia/hypoplasia
of the right transverse sinus with the superior sagittal sinus
continuing inferiorly to drain directly into the jugular vein. Deep
venous system drains via a normal appearing left transverse sinus
into the sigmoid sinus and jugular vein on that side.

## 2018-05-19 ENCOUNTER — Encounter (HOSPITAL_COMMUNITY): Payer: Self-pay | Admitting: Emergency Medicine

## 2018-05-19 ENCOUNTER — Ambulatory Visit (HOSPITAL_COMMUNITY)
Admission: EM | Admit: 2018-05-19 | Discharge: 2018-05-19 | Disposition: A | Discharge status: Home / Self Care | Payer: Self-pay | Attending: Family Medicine | Admitting: Family Medicine

## 2018-05-19 DIAGNOSIS — M545 Low back pain, unspecified: Secondary | ICD-10-CM

## 2018-05-19 MED ORDER — MELOXICAM 7.5 MG PO TABS: 7.5000 mg | ORAL_TABLET | Freq: Every day | ORAL | 0 refills | Status: DC

## 2018-05-19 MED ORDER — PREDNISONE 50 MG PO TABS: 50.0000 mg | ORAL_TABLET | Freq: Every day | ORAL | 0 refills | Status: DC

## 2018-05-19 MED ORDER — CYCLOBENZAPRINE HCL 5 MG PO TABS: 5.0000 mg | ORAL_TABLET | Freq: Every evening | ORAL | 0 refills | Status: DC | PRN

## 2018-05-19 NOTE — ED Provider Notes (Signed)
[facility]    CSN: [id number] Arrival date & time: 05/19/18  1424     History   Chief Complaint Chief Complaint  Patient presents with  . Back Pain    HPI Savannah [name] is a 32 y.o. female.   32 year old female comes in for 2-week history of left lower back pain.  States it started intermittent, now worsening and more constant.  Certain movement causes radiation down the left leg.  Pain is worse with movement.  She denies injury/trauma, heavy lifting, increase in activity.  She has some numbness and tingling to the toes.  Denies urinary symptoms such as frequency, dysuria, hematuria.  Denies saddle anesthesia, loss of bladder or bowel control.  States it is hard to ambulate after sitting for too long, and usually radiation to the left leg happens when she goes from a sitting to standing position.  She has been taking Tylenol without relief.  States tried to see her PCP, was given an appointment 3 weeks out, and came in here for evaluation.     Past Medical History:  Diagnosis Date  . Anxiety   . Pseudotumor cerebri 04/03/2017    Patient Active Problem List   Diagnosis Date Noted  . Pseudotumor cerebri 04/03/2017  . Normal delivery 01/09/2013    Past Surgical History:  Procedure Laterality Date  . NO PAST SURGERIES      OB History    Gravida  3   Para  3   Term  3   Preterm  0   AB  0   Living  3     SAB  0   TAB  0   Ectopic  0   Multiple  0   Live Births  3            Home Medications    Prior to Admission medications   Medication Sig Start Date End Date Taking? Authorizing Provider  acetaZOLAMIDE (DIAMOX) 250 MG tablet TAKE 2 TABLETS BY MOUTH TWICE DAILY 07/15/17   Butch PennyMillikan, Megan, NP  cyclobenzaprine (FLEXERIL) 5 MG tablet Take 1 tablet (5 mg total) by mouth at bedtime as needed for muscle spasms. 05/19/18   Belinda FisherYu, Cassadee Vanzandt V, PA-C  levonorgestrel (MIRENA) 20 MCG/24HR IUD 1 each by Intrauterine route once. Implanted November 2017     [provider]  meloxicam (MOBIC) 7.5 MG tablet Take 1 tablet (7.5 mg total) by mouth daily. 05/19/18   Cathie HoopsYu, Vitaliy Eisenhour V, PA-C  Multiple Vitamin (MULTIVITAMIN WITH MINERALS) TABS tablet Take 1 tablet by mouth daily.    [provider]  predniSONE (DELTASONE) 50 MG tablet Take 1 tablet (50 mg total) by mouth daily. 05/19/18   Belinda FisherYu, Trena Dunavan V, PA-C    Family History Family History  Problem Relation Age of Onset  . Diabetes Mother   . Diabetes Father   . High blood pressure Father     Social History Social History   Tobacco Use  . Smoking status: Never Smoker  . Smokeless tobacco: Never Used  Substance Use Topics  . Alcohol use: No  . Drug use: No     Allergies   Patient has no known allergies.   Review of Systems Review of Systems  Reason unable to perform ROS: See HPI as above.     Physical Exam Triage Vital Signs ED Triage Vitals [05/19/18 1449]  Enc Vitals Group     BP 126/73     Pulse Rate 69     Resp 16  Temp 98.4 F (36.9 C)     Temp Source Oral     SpO2 100 %     Weight      Height      Head Circumference      Peak Flow      Pain Score      Pain Loc      Pain Edu?      Excl. in GC?    No data found.  Updated Vital Signs BP 126/73 (BP Location: Left Arm)   Pulse 69   Temp 98.4 F (36.9 C) (Oral)   Resp 16   SpO2 100%   Physical Exam  Constitutional: She is oriented to person, place, and time. She appears well-developed and well-nourished. No distress.  HENT:  Head: Normocephalic and atraumatic.  Eyes: Pupils are equal, round, and reactive to light. Conjunctivae are normal.  Cardiovascular: Normal rate, regular rhythm and normal heart sounds. Exam reveals no gallop and no friction rub.  No murmur heard. Pulmonary/Chest: Effort normal and breath sounds normal. No accessory muscle usage or stridor. No respiratory distress. She has no decreased breath sounds. She has no wheezes. She has no rhonchi. She has no rales.  Musculoskeletal:    No tenderness on palpation of the spinous processes. Tenderness to palpation of left lumbar region/buttock. Full range of motion of back. Strength normal and equal bilaterally. Sensation intact and equal bilaterally. Pain worse in left lower back with straight leg raise, questionable radiation during straight leg raise.   Neurological: She is alert and oriented to person, place, and time.  Skin: Skin is warm and dry. She is not diaphoretic.    UC Treatments / Results  Labs (all labs ordered are listed, but only abnormal results are displayed) Labs Reviewed - No data to display  EKG None  Radiology No results found.  Procedures Procedures (including critical care time)  Medications Ordered in UC Medications - No data to display  Initial Impression / Assessment and Plan / UC Course  I have reviewed the triage vital signs and the nursing notes.  Pertinent labs & imaging results that were available during my care of the patient were reviewed by me and considered in my medical decision making (see chart for details).    Exam inconsistent with sciatica, as although has some tightness to the left posterior leg during straight leg raise, pain worse around lumbar region. Will still cover with prednisone. Muscle relaxant as needed. Rx of Mobic provided, can start if still with lingering pain after prednisone. Return precautions given. Patient to follow up with PCP as scheduled if symptoms not improving.  Final Clinical Impressions(s) / UC Diagnoses   Final diagnoses:  Acute left-sided low back pain without sciatica    ED Prescriptions    Medication Sig Dispense Auth. Provider   predniSONE (DELTASONE) 50 MG tablet Take 1 tablet (50 mg total) by mouth daily. 5 tablet [place] Sloop V, PA-C   cyclobenzaprine (FLEXERIL) 5 MG tablet Take 1 tablet (5 mg total) by mouth at bedtime as needed for muscle spasms. 10 tablet Britainy Kozub V, PA-C   meloxicam (MOBIC) 7.5 MG tablet Take 1 tablet (7.5 mg total)  by mouth daily. 15 tablet Threasa Alpha, [place] 05/19/18 1513

## 2018-05-19 NOTE — Discharge Instructions (Signed)
Start prednisone as directed. Flexeril as needed at night. Flexeril can make you drowsy, so do not take if you are going to drive, operate heavy machinery, or make important decisions. Ice/heat compresses as needed. This can take up to 3-4 weeks to completely resolve, but you should be feeling better each week. Follow up here or with PCP if symptoms worsen, changes for reevaluation. If experience numbness/tingling of the inner thighs, loss of bladder or bowel control, go to the emergency department for evaluation.   Can fill Mobic if symptoms still persisting after prednisone.

## 2018-05-19 NOTE — ED Triage Notes (Signed)
Pt here for left sided back pain with radiation down left leg

## 2018-06-09 ENCOUNTER — Other Ambulatory Visit: Payer: Self-pay

## 2018-06-09 ENCOUNTER — Ambulatory Visit (INDEPENDENT_AMBULATORY_CARE_PROVIDER_SITE_OTHER): Payer: Self-pay | Admitting: Physician Assistant

## 2018-06-09 ENCOUNTER — Encounter (INDEPENDENT_AMBULATORY_CARE_PROVIDER_SITE_OTHER): Payer: Self-pay | Admitting: Physician Assistant

## 2018-06-09 VITALS — BP 131/79 | HR 62 | Temp 97.8°F | Ht 64.5 in | Wt 182.6 lb

## 2018-06-09 DIAGNOSIS — M545 Low back pain, unspecified: Secondary | ICD-10-CM

## 2018-06-09 DIAGNOSIS — G8929 Other chronic pain: Secondary | ICD-10-CM

## 2018-06-09 MED ORDER — MELOXICAM 15 MG PO TABS: 15.0000 mg | ORAL_TABLET | Freq: Every day | ORAL | 1 refills | Status: DC | PRN

## 2018-06-09 MED ORDER — ACETAMINOPHEN 500 MG PO TABS: 1000.0000 mg | ORAL_TABLET | Freq: Three times a day (TID) | ORAL | 1 refills | Status: AC | PRN

## 2018-06-09 MED FILL — MELOXICAM 15 MG TABLET: 15 | 30 days supply | Qty: 30 | Fill #0

## 2018-06-09 NOTE — Patient Instructions (Signed)

## 2018-06-09 NOTE — Progress Notes (Signed)
Subjective:  Patient ID: Savannah [name], female    DOB: 02/22/1986  Age: 32 y.o. MRN: [id number]  CC:  Urgent care f/u back pain  HPI Rocha-Garciais a 32 y.o.femalewith a PMH of anxiety, papilledema, and pseudomotor cerebripresents with back pain. Went to urgent care three weeks ago for back pain and prescribed prednisone, meloxicam,and cyclobenzaprine which resolved back pain temporarily. Back pain returned two days ago but is only concentrated to the midline of the lumbar spine as opposed to original presentation which included R>L LBP with left sided radiculopathy. No saddle paresthesia, urinary incontinence, fecal incontinence, weakness, paralysis, or hx of injury. No XR on file. Does not endorse any other symptoms or complaints.      Outpatient Medications Prior to Visit  Medication Sig Dispense Refill  . levonorgestrel (MIRENA) 20 MCG/24HR IUD 1 each by Intrauterine route once. Implanted November 2017    . Multiple Vitamin (MULTIVITAMIN WITH MINERALS) TABS tablet Take 1 tablet by mouth daily.    Marland Kitchen. acetaZOLAMIDE (DIAMOX) 250 MG tablet TAKE 2 TABLETS BY MOUTH TWICE DAILY (Patient not taking: Reported on 06/09/2018) 120 tablet 5  . cyclobenzaprine (FLEXERIL) 5 MG tablet Take 1 tablet (5 mg total) by mouth at bedtime as needed for muscle spasms. (Patient not taking: Reported on 06/09/2018) 10 tablet 0  . meloxicam (MOBIC) 7.5 MG tablet Take 1 tablet (7.5 mg total) by mouth daily. (Patient not taking: Reported on 06/09/2018) 15 tablet 0  . predniSONE (DELTASONE) 50 MG tablet Take 1 tablet (50 mg total) by mouth daily. 5 tablet 0   No facility-administered medications prior to visit.      ROS Review of Systems  Constitutional: Negative for chills, fever and malaise/fatigue.  Eyes: Negative for blurred vision.  Respiratory: Negative for shortness of breath.   Cardiovascular: Negative for chest pain and palpitations.  Gastrointestinal: Negative for abdominal pain and nausea.   Genitourinary: Negative for dysuria and hematuria.  Musculoskeletal: Positive for back pain. Negative for joint pain and myalgias.  Skin: Negative for rash.  Neurological: Negative for tingling and headaches.  Psychiatric/Behavioral: Negative for depression. The patient is not nervous/anxious.     Objective:  BP 131/79 (BP Location: Left Arm, Patient Position: Sitting, Cuff Size: Normal)   Pulse 62   Temp 97.8 F (36.6 C) (Oral)   Ht 5' 4.5" (1.638 m)   Wt 182 lb 9.6 oz (82.8 kg)   SpO2 98%   BMI 30.86 kg/m   BP/Weight 06/09/2018 05/19/2018 12/30/2017  Systolic BP 131 126 129  Diastolic BP 79 73 83  Wt. (Lbs) 182.6 - 183.5  BMI 30.86 - 31.01      Physical Exam  Constitutional: She is oriented to person, place, and time.  Well developed, well nourished, NAD, polite  HENT:  Head: Normocephalic and atraumatic.  Eyes: No scleral icterus.  Neck: Normal range of motion. Neck supple. No thyromegaly present.  Cardiovascular: Normal rate, regular rhythm and normal heart sounds.  Pulmonary/Chest: Effort normal and breath sounds normal.  Musculoskeletal: She exhibits no edema.  Full aROM of back with pain elicited on extension > flexion  Neurological: She is alert and oriented to person, place, and time.  Skin: Skin is warm and dry. No rash noted. No erythema. No pallor.  Psychiatric: She has a normal mood and affect. Her behavior is normal. Thought content normal.  Vitals reviewed.    Assessment & Plan:    1. Chronic midline low back pain without sciatica - Pt advised to apply for  CAFA. - DG Lumbar Spine Complete; Future - Begin acetaminophen (TYLENOL) 500 MG tablet; Take 2 tablets (1,000 mg total) by mouth every 8 (eight) hours as needed for up to 7 days.  Dispense: 21 tablet; Refill: 1 - Begin meloxicam (MOBIC) 15 MG tablet; Take 1 tablet (15 mg total) by mouth daily as needed for pain.  Dispense: 30 tablet; Refill: 1   Meds ordered this encounter  Medications  .  acetaminophen (TYLENOL) 500 MG tablet    Sig: Take 2 tablets (1,000 mg total) by mouth every 8 (eight) hours as needed for up to 7 days.    Dispense:  21 tablet    Refill:  1    Order Specific Question:   Supervising Provider    Answer:   Hoy Register [[id number]]  . meloxicam (MOBIC) 15 MG tablet    Sig: Take 1 tablet (15 mg total) by mouth daily as needed for pain.    Dispense:  30 tablet    Refill:  1    Order Specific Question:   Supervising Provider    Answer:   Hoy Register [[id number]]    Follow-up: Return in about 1 month (around 07/07/2018) for LBP.   Loletta Specter PA

## 2018-06-12 ENCOUNTER — Ambulatory Visit (HOSPITAL_COMMUNITY)
Admission: RE | Admit: 2018-06-12 | Discharge: 2018-06-12 | Disposition: A | Discharge status: Home / Self Care | Payer: Self-pay | Source: Ambulatory Visit | Attending: Physician Assistant | Admitting: Physician Assistant

## 2018-06-12 DIAGNOSIS — G8929 Other chronic pain: Secondary | ICD-10-CM | POA: Insufficient documentation

## 2018-06-12 DIAGNOSIS — M545 Low back pain: Secondary | ICD-10-CM | POA: Insufficient documentation

## 2018-06-12 DIAGNOSIS — M5137 Other intervertebral disc degeneration, lumbosacral region: Secondary | ICD-10-CM | POA: Insufficient documentation

## 2018-06-19 ENCOUNTER — Telehealth (INDEPENDENT_AMBULATORY_CARE_PROVIDER_SITE_OTHER): Payer: Self-pay

## 2018-06-19 NOTE — Telephone Encounter (Signed)
Call placed using pacific interpreter [phone]) patient is aware that XR reveals disc degeneration and spurring. Needs to complete CAFA application so that PCP may refer to ortho. Maryjean Mornempestt S Roberts, CMA

## 2018-06-19 NOTE — Telephone Encounter (Signed)
-----   Message from Loletta Specteroger David Gomez, PA-C sent at 06/15/2018  1:28 PM EDT ----- Disc degeneration and spurring L5-S1. Complete CAFA so that I may send to orthopedics.

## 2018-07-08 ENCOUNTER — Ambulatory Visit: Payer: Self-pay | Admitting: Neurology

## 2018-07-08 ENCOUNTER — Encounter: Payer: Self-pay | Admitting: Neurology

## 2018-07-08 VITALS — BP 110/70 | HR 57 | Wt 181.5 lb

## 2018-07-08 DIAGNOSIS — G932 Benign intracranial hypertension: Secondary | ICD-10-CM

## 2018-07-08 MED ORDER — ACETAZOLAMIDE ER 500 MG PO CP12: 500.0000 mg | ORAL_CAPSULE | Freq: Two times a day (BID) | ORAL | 3 refills | Status: DC

## 2018-07-08 NOTE — Progress Notes (Signed)
Reason for visit: Pseudotumor cerebri  Savannah [name] is an 32 y.o. female  History of present illness:  Savannah [name] is a 32 year old right-handed Hispanic female with a history of pseudotumor cerebri.  The evaluation was done through an interpreter.  The patient was diagnosed in May 2018, lumbar puncture at that time revealed an opening pressure of 40 cm.  The patient had begun having headaches only 2 weeks prior to presentation to her optometrist who noted papilledema.  The patient has been placed on Diamox, she has been worked up on a 500 mg twice daily dose which has significantly improved her headache.  She may only have one headache every 2 weeks or so.  Headaches are usually bifrontal in nature.  She has not noted any change in her vision, she has last seen her ophthalmologist, Dr. Aura Camps, in May 2019.  She was told that the papilledema was improving, she was given new glasses prescription which has helped her vision.  She has not had any side effects on the Diamox such as weakness and tingling of the hands or feet.  She returns to this office for an evaluation.  She has gained 4 pounds since initially seen, she claims that she has not been working out much currently because she has developed low back pain, she is waiting on a referral to a specialist.  Past Medical History:  Diagnosis Date  . Anxiety   . Pseudotumor cerebri 04/03/2017    Past Surgical History:  Procedure Laterality Date  . NO PAST SURGERIES      Family History  Problem Relation Age of Onset  . Diabetes Mother   . Diabetes Father   . High blood pressure Father     Social history:  reports that she has never smoked. She has never used smokeless tobacco. She reports that she does not drink alcohol or use drugs.   No Known Allergies  Medications:  Prior to Admission medications   Medication Sig Start Date End Date Taking? Authorizing Provider  acetaZOLAMIDE (DIAMOX) 250 MG tablet TAKE  2 TABLETS BY MOUTH TWICE DAILY 07/15/17  Yes Butch Penny, NP  levonorgestrel (MIRENA) 20 MCG/24HR IUD 1 each by Intrauterine route once. Implanted November 2017   Yes [provider]  meloxicam (MOBIC) 15 MG tablet Take 1 tablet (15 mg total) by mouth daily as needed for pain. 06/09/18  Yes Loletta Specter, PA-C  Multiple Vitamin (MULTIVITAMIN WITH MINERALS) TABS tablet Take 1 tablet by mouth daily.   Yes [provider]    ROS:  Out of a complete 14 system review of symptoms, the patient complains only of the following symptoms, and all other reviewed systems are negative.  Eye itching Frequency of urination Low back pain, neck pain Snoring  Blood pressure 110/70, pulse (!) 57, weight 181 lb 8 oz (82.3 kg), SpO2 98 %.  Physical Exam  General: The patient is alert and cooperative at the time of the examination.  Skin: No significant peripheral edema is noted.   Neurologic Exam  Mental status: The patient is alert and oriented x 3 at the time of the examination. The patient has apparent normal recent and remote memory, with an apparently normal attention span and concentration ability.   Cranial nerves: Facial symmetry is present. Speech is normal, no aphasia or dysarthria is noted. Extraocular movements are full. Visual fields are full.  Pupils are equal, round, and reactive to light.  Discs show minimal during, no venous pulsations  are seen.  No hemorrhages are noted.  Motor: The patient has good strength in all 4 extremities.  Sensory examination: Soft touch sensation is symmetric on the face, arms, and legs.  Coordination: The patient has good finger-nose-finger and heel-to-shin bilaterally.  Gait and station: The patient has a normal gait. Tandem gait is normal. Romberg is negative. No drift is seen.  Reflexes: Deep tendon reflexes are symmetric.   Assessment/Plan:  1.  Pseudotumor cerebri  The patient is tolerating the Diamox well, I will give  her a prescription for the 500 mg tablets taking 1 twice daily.  She will follow-up through this office in 1 year, sooner if needed.  She will continue to follow-up with her ophthalmologist.  Marlan Palau. Keith Jisell Majer MD 07/08/2018 10:11 AM  [facility] [address] [place], [place] [id number]  Phone [phone] Fax [phone]

## 2018-07-10 ENCOUNTER — Ambulatory Visit (INDEPENDENT_AMBULATORY_CARE_PROVIDER_SITE_OTHER): Payer: Self-pay | Admitting: Physician Assistant

## 2018-07-10 ENCOUNTER — Other Ambulatory Visit: Payer: Self-pay

## 2018-07-10 ENCOUNTER — Encounter (INDEPENDENT_AMBULATORY_CARE_PROVIDER_SITE_OTHER): Payer: Self-pay | Admitting: Physician Assistant

## 2018-07-10 VITALS — BP 113/73 | HR 54 | Temp 97.9°F | Ht 64.5 in | Wt 183.2 lb

## 2018-07-10 DIAGNOSIS — M545 Low back pain, unspecified: Secondary | ICD-10-CM

## 2018-07-10 DIAGNOSIS — Z6830 Body mass index (BMI) 30.0-30.9, adult: Secondary | ICD-10-CM

## 2018-07-10 DIAGNOSIS — E66811 Obesity, class 1: Secondary | ICD-10-CM

## 2018-07-10 DIAGNOSIS — Z23 Encounter for immunization: Secondary | ICD-10-CM

## 2018-07-10 DIAGNOSIS — E669 Obesity, unspecified: Secondary | ICD-10-CM

## 2018-07-10 DIAGNOSIS — G8929 Other chronic pain: Secondary | ICD-10-CM

## 2018-07-10 MED ORDER — TOPIRAMATE 25 MG PO TABS: 25.0000 mg | ORAL_TABLET | Freq: Two times a day (BID) | ORAL | 2 refills | Status: DC

## 2018-07-10 MED ORDER — MELOXICAM 15 MG PO TABS: 15.0000 mg | ORAL_TABLET | Freq: Every day | ORAL | 0 refills | Status: DC | PRN

## 2018-07-10 MED FILL — MELOXICAM 15 MG TABLET: 15 | 30 days supply | Qty: 30 | Fill #0

## 2018-07-10 MED FILL — TOPIRAMATE 25 MG TABLET: 25 | 15 days supply | Qty: 30 | Fill #0

## 2018-07-10 NOTE — Patient Instructions (Signed)
Influenza Virus Vaccine injection (Fluarix) Qu es este medicamento? La VACUNA ANTIGRIPAL ayuda a disminuir el riesgo de contraer la influenza, tambin conocida como la gripe. La vacuna solo ayuda a protegerle contra algunas cepas de influenza. Esta vacuna no ayuda a reducir el riesgo de contraer influenza pandmica H1N1. Este medicamento puede ser utilizado para otros usos; si tiene alguna pregunta consulte con su proveedor de atencin mdica o con su farmacutico. MARCAS COMUNES: Fluarix, Fluzone Qu le debo informar a mi profesional de la salud antes de tomar este medicamento? Necesita saber si usted presenta alguno de los siguientes problemas o situaciones: -trastorno de sangrado como hemofilia -fiebre o infeccin -sndrome de Guillain-Barre u otros problemas neurolgicos -problemas del sistema inmunolgico -infeccin por el virus de la inmunodeficiencia humana (VIH) o SIDA -niveles bajos de plaquetas en la sangre -esclerosis mltiple -una reaccin alrgica o inusual a las vacunas antigripales, a los huevos, protenas de pollo, al ltex, a la gentamicina, a otros medicamentos, alimentos, colorantes o conservantes -si est embarazada o buscando quedar embarazada -si est amamantando a un beb Cmo debo utilizar este medicamento? Esta vacuna se administra mediante inyeccin por va intramuscular. Lo administra un profesional de la salud. Recibir una copia de informacin escrita sobre la vacuna antes de cada vacuna. Asegrese de leer este folleto cada vez cuidadosamente. Este folleto puede cambiar con frecuencia. Hable con su pediatra para informarse acerca del uso de este medicamento en nios. Puede requerir atencin especial. Sobredosis: Pngase en contacto inmediatamente con un centro toxicolgico o una sala de urgencia si usted cree que haya tomado demasiado medicamento. ATENCIN: Este medicamento es solo para usted. No comparta este medicamento con nadie. Qu sucede si me olvido de  una dosis? No se aplica en este caso. Qu puede interactuar con este medicamento? -quimioterapia o radioterapia -medicamentos que suprimen el sistema inmunolgico, tales como etanercept, anakinra, infliximab y adalimumab -medicamentos que tratan o previenen cogulos sanguneos, como warfarina -fenitona -medicamentos esteroideos, como la prednisona o la cortisona -teofilina -vacunas Puede ser que esta lista no menciona todas las posibles interacciones. Informe a su profesional de la salud de todos los productos a base de hierbas, medicamentos de venta libre o suplementos nutritivos que est tomando. Si usted fuma, consume bebidas alcohlicas o si utiliza drogas ilegales, indqueselo tambin a su profesional de la salud. Algunas sustancias pueden interactuar con su medicamento. A qu debo estar atento al usar este medicamento? Informe a su mdico o a su profesional de la salud sobre todos los efectos secundarios que persistan despus de 3 das. Llame a su proveedor de atencin mdica si se presentan sntomas inusuales dentro de las 6 semanas posteriores a la vacunacin. Es posible que todava pueda contraer la gripe, pero la enfermedad no ser tan fuerte como normalmente. No puede contraer la gripe de esta vacuna. La vacuna antigripal no le protege contra resfros u otras enfermedades que pueden causar fiebre. Debe vacunarse cada ao. Qu efectos secundarios puedo tener al utilizar este medicamento? Efectos secundarios que debe informar a su mdico o a su profesional de la salud tan pronto como sea posible: -reacciones alrgicas como erupcin cutnea, picazn o urticarias, hinchazn de la cara, labios o lengua Efectos secundarios que, por lo general, no requieren atencin mdica (debe informarlos a su mdico o a su profesional de la salud si persisten o si son molestos): -fiebre -dolor de cabeza -molestias y dolores musculares -dolor, sensibilidad, enrojecimiento o hinchazn en el lugar de la  inyeccin -cansancio o debilidad Puede ser que esta lista no   menciona todos los posibles efectos secundarios. Comunquese a su mdico por asesoramiento mdico sobre los efectos secundarios. Usted puede informar los efectos secundarios a la FDA por telfono al 1-800-FDA-1088. Dnde debo guardar mi medicina? Esta vacuna se administra solamente en clnicas, farmacias, consultorio mdico u otro consultorio de un profesional de la salud y no necesitar guardarlo en su domicilio. ATENCIN: Este folleto es un resumen. Puede ser que no cubra toda la posible informacin. Si usted tiene preguntas acerca de esta medicina, consulte con su mdico, su farmacutico o su profesional de la salud.  2018 Elsevier/Gold Standard (2010-05-01 15:31:40)  

## 2018-07-10 NOTE — Progress Notes (Signed)
Subjective:  Patient ID: Savannah [name], female    DOB: 1985-12-21  Age: 32 y.o. MRN: [id number]  CC: LBP  HPI Rocha-Garciais a 32 y.o.femalewith a PMH of anxiety, papilledema, and pseudomotor cerebripresents to f/u on LBP. Had XR L spine on 06/12/18 which revealed disc degeneration and spurring L5 - S1. Does not currently have pain in the lower back. Requests treatment for obesity because she was recommended to lose weight by her neurologist. Pt states she has drastically changed diet to include less carbohydrates and more salads. She is wary of exercising due to findings on XR L spine. Does not endorse any other complaints or symptoms.     Outpatient Medications Prior to Visit  Medication Sig Dispense Refill  . acetaZOLAMIDE (DIAMOX) 500 MG capsule Take 1 capsule (500 mg total) by mouth 2 (two) times daily. 180 capsule 3  . levonorgestrel (MIRENA) 20 MCG/24HR IUD 1 each by Intrauterine route once. Implanted November 2017    . Multiple Vitamin (MULTIVITAMIN WITH MINERALS) TABS tablet Take 1 tablet by mouth daily.    . meloxicam (MOBIC) 15 MG tablet Take 1 tablet (15 mg total) by mouth daily as needed for pain. (Patient not taking: Reported on 07/10/2018) 30 tablet 1   No facility-administered medications prior to visit.      ROS Review of Systems  Constitutional: Negative for chills, fever and malaise/fatigue.  Eyes: Negative for blurred vision.  Respiratory: Negative for shortness of breath.   Cardiovascular: Negative for chest pain and palpitations.  Gastrointestinal: Negative for abdominal pain and nausea.  Genitourinary: Negative for dysuria and hematuria.  Musculoskeletal: Negative for joint pain and myalgias.  Skin: Negative for rash.  Neurological: Negative for tingling and headaches.  Psychiatric/Behavioral: Negative for depression. The patient is not nervous/anxious.     Objective:  BP 113/73 (BP Location: Left Arm, Patient Position: Sitting, Cuff Size:  Normal)   Pulse (!) 54   Temp 97.9 F (36.6 C) (Oral)   Ht 5' 4.5" (1.638 m)   Wt 183 lb 3.2 oz (83.1 kg)   SpO2 93%   BMI 30.96 kg/m   BP/Weight 07/10/2018 07/08/2018 06/09/2018  Systolic BP 113 110 131  Diastolic BP 73 70 79  Wt. (Lbs) 183.2 181.5 182.6  BMI 30.96 30.67 30.86      Physical Exam  Constitutional: She is oriented to person, place, and time.  Well developed, well nourished, NAD, polite  HENT:  Head: Normocephalic and atraumatic.  Eyes: No scleral icterus.  Neck: Normal range of motion. Neck supple. No thyromegaly present.  Cardiovascular: Normal rate, regular rhythm and normal heart sounds.  Pulmonary/Chest: Effort normal and breath sounds normal.  Musculoskeletal: She exhibits no edema.  Neurological: She is alert and oriented to person, place, and time.  Skin: Skin is warm and dry. No rash noted. No erythema. No pallor.  Psychiatric: She has a normal mood and affect. Her behavior is normal. Thought content normal.  Vitals reviewed.    Assessment & Plan:    1. Class 1 obesity with serious comorbidity and body mass index (BMI) of 30.0 to 30.9 in adult, unspecified obesity type -  Begin topiramate (TOPAMAX) 25 MG tablet; Take 1 tablet (25 mg total) by mouth 2 (two) times daily.  Dispense: 30 tablet; Refill: 2. This dose ok to use with levonorgestrel. Pt is fully aware of the interaction and the unsafe dosing of Topiramate.   2. Chronic midline low back pain without sciatica - Refill meloxicam (MOBIC) 15 MG tablet;  Take 1 tablet (15 mg total) by mouth daily as needed for pain.  Dispense: 30 tablet; Refill: 0  3. Need for prophylactic vaccination and inoculation against influenza - Flu Vaccine QUAD 6+ mos PF IM (Fluarix Quad PF)   Meds ordered this encounter  Medications  . topiramate (TOPAMAX) 25 MG tablet    Sig: Take 1 tablet (25 mg total) by mouth 2 (two) times daily.    Dispense:  30 tablet    Refill:  2    Order Specific Question:   Supervising  Provider    Answer:   Hoy RegisterNEWLIN, ENOBONG [[id number]]  . meloxicam (MOBIC) 15 MG tablet    Sig: Take 1 tablet (15 mg total) by mouth daily as needed for pain.    Dispense:  30 tablet    Refill:  0    Order Specific Question:   Supervising Provider    Answer:   Hoy RegisterNEWLIN, ENOBONG [[id number]]    Follow-up: Return in about 3 months (around 10/10/2018) for Obesity.   Loletta Specteroger David Charmane Protzman PA

## 2018-08-03 MED FILL — TOPIRAMATE 25 MG TABS: 25 | 15 days supply | Qty: 30 | Fill #1

## 2018-08-03 MED FILL — ACETAZOLAMIDE ER 500 MG CAP: 500 | 30 days supply | Qty: 60 | Fill #0

## 2018-10-16 ENCOUNTER — Other Ambulatory Visit: Payer: Self-pay

## 2018-10-16 ENCOUNTER — Encounter (INDEPENDENT_AMBULATORY_CARE_PROVIDER_SITE_OTHER): Payer: Self-pay | Admitting: Physician Assistant

## 2018-10-16 ENCOUNTER — Ambulatory Visit (INDEPENDENT_AMBULATORY_CARE_PROVIDER_SITE_OTHER): Payer: Self-pay | Admitting: Physician Assistant

## 2018-10-16 VITALS — BP 122/77 | HR 64 | Temp 97.9°F | Ht 64.5 in | Wt 184.4 lb

## 2018-10-16 DIAGNOSIS — Z6831 Body mass index (BMI) 31.0-31.9, adult: Secondary | ICD-10-CM

## 2018-10-16 DIAGNOSIS — E669 Obesity, unspecified: Secondary | ICD-10-CM

## 2018-10-16 MED ORDER — PHENTERMINE HCL 15 MG PO CAPS: 15.0000 mg | ORAL_CAPSULE | ORAL | 0 refills | Status: DC

## 2018-10-16 NOTE — Patient Instructions (Signed)
Obesidad en los adultos  Obesity, Adult  La obesidad es una afeccin que implica tener demasiada grasa corporal total. Tener sobrepeso u obesidad significa que el peso es mayor que lo que se considera saludable para el tamao corporal. La obesidad se determina por una medida llamada IMC. El IMC (ndice de masa corporal) es la estimacin de la grasa corporal y se calcula a partir de la altura y el peso. Si un adulto tiene un IMC de 30 o superior se considera obeso.  La obesidad puede conducir a algunos de los siguientes problemas de salud y enfermedades graves:   Ictus.   Enfermedad arterial coronaria (EAC).   Diabetes tipo 2.   Algunos tipos de cncer, incluido el cncer de colon, mama, tero y vescula.   Artrosis.   Presin arterial elevada (hipertensin arterial).   Colesterol elevado.   Apnea del sueo.   Clculos en la vescula.   Problemas de esterilidad.    Cules son las causas?  La causa principal de la obesidad es consumir ms caloras que las que su cuerpo usa para generar energa. Otros factores que contribuyen a esta condicin pueden incluir lo siguiente:   Nacer con genes que lo hacen ms propenso a ser obeso.   Tener una afeccin mdica que causa obesidad. Estos problemas incluyen los siguientes:  ? Hipotiroidismo.  ? Sndrome de ovario poliqustico (SOP).  ? Trastorno alimentario compulsivo.  ? Sndrome de [place].   Tomar ciertos medicamentos, como esteroides, antidepresivos y anticonvulsivos.   No ser fsicamente activo (estilo de vida sedentario).   Vivir en lugares en donde haya escasez de lugares para realizar actividad fsica de manera segura o para comprar alimentos saludables.   No dormir lo suficiente.    Qu incrementa el riesgo?  Los siguientes factores pueden aumentar el riesgo de esta afeccin:   Tener antecedentes familiares de obesidad.   Ser una mujer de origen afroamericano.   Ser un hombre de origen hispano.    Cules son los signos o los sntomas?  Tener grasa  corporal excesiva es el sntoma principal de esta afeccin.  Cmo se diagnostica?  Esta afeccin se puede diagnosticar en funcin de lo siguiente:   Sus sntomas.   Sus antecedentes mdicos.   Un examen fsico. El mdico puede medir lo siguiente:  ? Su IMC. Si usted es un adulto y su IMC es de entre 25 y 30, entonces tiene sobrepeso. Si usted es un adulto y su IMC es de 30 o ms, se considera que es obeso.  ? Las medidas alrededor (circunferencias) de la cadera y de la cintura. Estas medidas pueden tenerse en cuenta para diagnosticar la afeccin.  ? El grosor del pliegue cutneo. El mdico puede pellizcar suavemente un pliegue de la piel y medirlo.    Cmo se trata?  El tratamiento de esta afeccin frecuentemente incluye cambiar el estilo de vida. El tratamiento puede incluir algunos o todos los siguientes elementos:   Cambios en la dieta. Trabaje con el mdico y un nutricionista para establecer una meta de prdida de peso que sea saludable y razonable para usted. Los cambios en la dieta pueden incluir comer lo siguiente:  ? Porciones ms pequeas. El tamao de una porcin es la cantidad de alimento que se considera saludable ingerir en un determinado momento. Esto vara de una persona a otra.  ? Opciones de bajo contenido calrico o graso.  ? Ms cereales integrales, frutas y verduras.   Realizar actividad fsica con regularidad. Esto puede incluir   actividad aerbica (cardaca) y fortalecimiento muscular.   Medicamentos para ayudarlo a perder peso. Su mdico puede prescribir medicamentos si usted no puede perder 1 libra por semana despus de 6 semanas de comer ms saludablemente y hacer ms actividad fsica.   Someterse a una ciruga. Las opciones quirrgicas pueden incluir bandas gstricas y bypass gstrico. Se puede realizar una ciruga si:  ? Otros tratamientos mejoraron su afeccin.  ? Tiene un IMC de 40 o superior.  ? Tiene problemas de salud potencialmente mortales relacionados con la  obesidad.    Siga estas instrucciones en su casa:    Comida y bebida     Siga las indicaciones del mdico respecto de lo que puede comer o beber. El mdico puede indicarle que haga lo siguiente:  ? Limitar las comidas rpidas, los dulces y los snacks procesados.  ? Elegir opciones con bajo contenido de grasa, como leche descremada en lugar de leche entera.  ? Consumir 5o ms porciones de frutas o verduras por da.  ? Comer en casa con ms frecuencia. Esto le da ms control sobre lo que come.  ? Cuando coma afuera, elija alimentos saludables.  ? Aprenda cul es el tamao de una porcin saludable.  ? Tenga a mano colaciones con bajo contenido de grasa.  ? Evite las bebidas azucaradas, como refrescos, jugo de frutas, t helado endulzado con azcar y leche saborizada.  ? Desayune de forma saludable.   Beba suficiente agua para mantener la orina clara o de color amarillo plido.   No deje transcurrir largos perodos de tiempo sin comer (no ayune) ni siga una dieta de moda. El ayuno y las dietas de moda pueden ser insalubres, e incluso peligrosas.  Actividad fsica   Haga actividad fsica habitualmente como se lo haya indicado el mdico. Consulte al mdico qu tipo de ejercicios es seguro para usted y con qu frecuencia debe ejercitarse.   Precaliente y elongue adecuadamente antes de la actividad.   Reljese y elongue despus de realizar una actividad.   Descanse entre los perodos de actividad.  Estilo de vida   Limite el tiempo que pasa mirando televisin, o usando una computadora o un videojuego.   Busque formas de recompensarse que no incluyan alimentos.   Limite el consumo de alcohol a no ms de 1 medida por da si es mujer y no est embarazada y a 2 medidas por da si es hombre. Una medida equivale a 12onzas de cerveza, 5onzas de vino o 1onzas de bebidas alcohlicas de alta graduacin.  Instrucciones generales   Registre la prdida de peso en un diario para realizar un seguimiento de los alimentos  que consume y cunto se ejercita.   Tome los medicamentos de venta libre y los recetados solamente como se lo haya indicado el mdico.   Tome vitaminas y suplementos solamente como se lo haya indicado el mdico.   Considere la posibilidad de participar en un grupo de apoyo. El mdico podra recomendarle un grupo de apoyo.   Concurra a todas las visitas de control como se lo haya indicado el mdico. Esto es importante.  Comunquese con un mdico si:   No puede alcanzar su objetivo de prdida de peso despus de 6 semanas de cambios en la dieta y en el estilo de vida.  Esta informacin no tiene como fin reemplazar el consejo del mdico. Asegrese de hacerle al mdico cualquier pregunta que tenga.  Document Released: 10/28/2005 Document Revised: 01/31/2017 Document Reviewed: 08/16/2015  Elsevier Interactive Patient Education    2018 [employer]

## 2018-10-16 NOTE — Progress Notes (Signed)
Subjective:  Patient ID: Savannah [name], female    DOB: 01/10/1986  Age: 32 y.o. MRN: [id number]  CC: f/u obesity  HPI Savannah [name] is a 32 y.o. female with a medical history of anxiety and pseudotumor cerebri presents to f/u on obesity. Weighed 183 lbs with a BMI of 30.96 kg/m 15 weeks ago. Prescribed Topamax 25 mg and has been taking for three months. States she took 30 pills of topiramate and did not refill due to ineffectiveness. Weighs 184 lbs today. Not exercising or dieting at the moment. Although, she does try to eat reduction portions of meals.     Pt //currently taking Diamox for pseudotumor cerebri. Neurology note from 07/08/18 shows neurologist plan to refill Diamox at 500 mg one tablet BID. She is to return to neurology on August 2020. She was advised to continue check ups with ophthalmologist. Does not endorse HA, visual deficits, CP, palpitations, SOB, tingling, numbness, f/c/n/f, abdominal pain, swelling, rash, or GI/GU sxs.     Outpatient Medications Prior to Visit  Medication Sig Dispense Refill  . acetaZOLAMIDE (DIAMOX) 500 MG capsule Take 1 capsule (500 mg total) by mouth 2 (two) times daily. 180 capsule 3  . levonorgestrel (MIRENA) 20 MCG/24HR IUD 1 each by Intrauterine route once. Implanted November 2017    . Multiple Vitamin (MULTIVITAMIN WITH MINERALS) TABS tablet Take 1 tablet by mouth daily.    Marland Kitchen. topiramate (TOPAMAX) 25 MG tablet Take 1 tablet (25 mg total) by mouth 2 (two) times daily. 30 tablet 2  . meloxicam (MOBIC) 15 MG tablet Take 1 tablet (15 mg total) by mouth daily as needed for pain. 30 tablet 0   No facility-administered medications prior to visit.      ROS Review of Systems  Constitutional: Negative for chills, fever and malaise/fatigue.  Eyes: Negative for blurred vision.  Respiratory: Negative for shortness of breath.   Cardiovascular: Negative for chest pain and palpitations.  Gastrointestinal: Negative for abdominal pain and  nausea.  Genitourinary: Negative for dysuria and hematuria.  Musculoskeletal: Negative for joint pain and myalgias.  Skin: Negative for rash.  Neurological: Negative for tingling and headaches.  Psychiatric/Behavioral: Negative for depression. The patient is not nervous/anxious.     Objective:  BP 122/77 (BP Location: Left Arm, Patient Position: Sitting, Cuff Size: Normal)   Pulse 64   Temp 97.9 F (36.6 C) (Oral)   Ht 5' 4.5" (1.638 m)   Wt 184 lb 6.4 oz (83.6 kg)   SpO2 96%   BMI 31.16 kg/m   BP/Weight 10/16/2018 07/10/2018 07/08/2018  Systolic BP 122 113 110  Diastolic BP 77 73 70  Wt. (Lbs) 184.4 183.2 181.5  BMI 31.16 30.96 30.67      Physical Exam  Constitutional: She is oriented to person, place, and time.  Well developed, well nourished, NAD, polite  HENT:  Head: Normocephalic and atraumatic.  Eyes: No scleral icterus.  Neck: Normal range of motion. Neck supple. No thyromegaly present.  Cardiovascular: Normal rate, regular rhythm and normal heart sounds.  Pulmonary/Chest: Effort normal and breath sounds normal.  Abdominal: Soft. Bowel sounds are normal. There is no tenderness.  Musculoskeletal: She exhibits no edema.  Neurological: She is alert and oriented to person, place, and time.  Skin: Skin is warm and dry. No rash noted. No erythema. No pallor.  Psychiatric: She has a normal mood and affect. Her behavior is normal. Thought content normal.  Vitals reviewed.    Assessment & Plan:    1.  Class 1 obesity with body mass index (BMI) of 31.0 to 31.9 in adult, unspecified obesity type, unspecified whether serious comorbidity present - phentermine 15 MG capsule; Take 1 capsule (15 mg total) by mouth every morning.  Dispense: 30 capsule; Refill: 0 - I have informed patient of phentermine and diamox interaction which may alter seizure control. Pt denies having seizures. I have also informed on the increase in blood pressure and heart rate that may occur. She was  told to stop phentermine should her blood pressure pass 130 systolic resting. Pt advised to put forth effort into dieting and exercise. Says she will acquire gym membership and be mindful of food portions during this holiday season. Advised to return in 4 weeks for evaluation of phentermine efficacy and any side effects. Pt understood she will not be receiving phentermine for greater than three months barring any side effects, of which phentermine will be immediately discontinued.     Meds ordered this encounter  Medications  . phentermine 15 MG capsule    Sig: Take 1 capsule (15 mg total) by mouth every morning.    Dispense:  30 capsule    Refill:  0    Order Specific Question:   Supervising Provider    Answer:   Hoy Register [[id number]]    Follow-up: Return in about 4 weeks (around 11/13/2018) for obesity.   Loletta Specter PA

## 2018-11-13 ENCOUNTER — Ambulatory Visit (INDEPENDENT_AMBULATORY_CARE_PROVIDER_SITE_OTHER): Payer: Self-pay | Admitting: Physician Assistant

## 2018-11-13 ENCOUNTER — Encounter (INDEPENDENT_AMBULATORY_CARE_PROVIDER_SITE_OTHER): Payer: Self-pay | Admitting: Physician Assistant

## 2018-11-13 ENCOUNTER — Other Ambulatory Visit: Payer: Self-pay

## 2018-11-13 VITALS — BP 128/81 | HR 60 | Temp 97.8°F | Ht 64.5 in | Wt 183.0 lb

## 2018-11-13 DIAGNOSIS — E669 Obesity, unspecified: Secondary | ICD-10-CM

## 2018-11-13 DIAGNOSIS — Z6831 Body mass index (BMI) 31.0-31.9, adult: Secondary | ICD-10-CM

## 2018-11-13 NOTE — Patient Instructions (Addendum)
Please call one of the weight loss clinics in the list I provided you. You will need a more comprehensive weight loss program.   Obesidad en los adultos (Obesity, Adult) La obesidad es un exceso de Art gallery manager. Si usted tiene un ndice de masa corporal (IMC) de 30o ms, esto significa que es obeso. El [facility] es un nmero que indica la cantidad de grasa corporal que tiene una persona. A menudo, la causa de la obesidad es el consumo de ms caloras que las que el cuerpo [place]. La obesidad puede causar problemas de salud graves. Cambiar el estilo de vida puede ayudar a tratar la obesidad. CUIDADOS EN EL HOGAR Comida y bebida  Siga las indicaciones del mdico respecto de las comidas y las bebidas. Su mdico puede recomendarle lo siguiente: ? Reducir Geneticist, molecular) las comidas rpidas, los dulces y las colaciones procesadas. ? Elegir opciones con bajo contenido de [place]. Por ejemplo, Counsellor de [employer]. ? Consumir 5o ms porciones de frutas o verduras por da. ? Comer en casa con ms frecuencia. Esto le da ms control sobre lo que come. ? Cuando coma afuera, elija alimentos saludables. ? Aprenda cul es el tamao de una porcin saludable. El tamao de una porcin es la cantidad de un alimento determinado que se considera saludable consumir de [place]. Esto es diferente para Advertising account planner. ? Tenga a mano colaciones con bajo contenido de grasas. ? Evite las bebidas azucaradas. Estas incluyen refrescos, jugo de frutas, t helado endulzado con azcar y [place] saborizada. ? Desayune de forma saludable.  Beba suficiente agua para mantener la orina clara o de color amarillo plido.  No est sin comer durante perodos prolongados (no ayune).  No haga dietas de moda (dietas pasajeras). Actividad fsica  Haga ejercicios con frecuencia, como se lo haya indicado el mdico. Pregntele al mdico lo siguiente: ? Los tipos de ejercicios que son seguros para usted. ? La frecuencia con la que  [employer] ejercicios.  Haga un precalentamiento y elongacin adecuados antes de la [place].  Haga un estiramiento lento despus de la actividad (relajacin).  Descanse entre los perodos de [place]. Estilo de vida  Limite el tiempo que pasa frente a la televisin, la computadora o los videojuegos (sea menos sedentario).  Busque formas de recompensarse que no incluyan alimentos.  Limite el consumo de alcohol a no ms de 1 medida por da si es mujer y no est Orthoptist, y 2 medidas por da si es hombre. Una medida equivale a 12onzas de cerveza, 5onzas de vino o 1onzas de bebidas alcohlicas de alta graduacin. Instrucciones generales  Lleve un diario de la prdida de [place]. Esto puede ayudarlo a Youth worker de lo siguiente: ? Los alimentos que come. ? Los ejercicios que hace.  Tome los medicamentos de venta libre y los recetados solamente como se lo haya indicado el mdico.  Tome vitaminas y suplementos solamente como se lo haya indicado el mdico.  Considere participar en un grupo de apoyo. Su mdico puede ayudarlo con esto.  Concurra a todas las visitas de control como se lo haya indicado el mdico. Esto es importante. SOLICITE AYUDA SI:  No puede alcanzar su objetivo de prdida de peso despus de haber modificado su dieta y su estilo de vida durante 6semanas. Esta informacin no tiene Theme park manager el consejo del mdico. Asegrese de hacerle al mdico cualquier pregunta que tenga. Document Released: 04/28/2012 Document Revised: 02/19/2016 Document Reviewed: 08/16/2015 Elsevier Interactive Patient Education  2019 [place].

## 2018-11-13 NOTE — Progress Notes (Signed)
Subjective:  Patient ID: Savannah [name], female    DOB: [birth year]  Age: 33 y.o. MRN: [id number]  CC: f/u obesity  HPI  Savannah [name] is a 33 y.o. female with a medical history of anxiety and pseudotumor cerebri presents to f/u on obesity. Weighed 184 lbs last month with a BMI of 31.16. Prescribed phentermine 15 mg after having failed topiramate. Taking phentermine 15 mg as directed but has only lost one pound. Admits to dietary indiscretion during the holiday season. Has been walking occasionally when the weather allows. Walks approximately 2 miles per exercise session. Has noticed mild ankle swelling after walking. Denies having experienced CP, palpitations, HA, SOB, f/c/n/v, abdominal pain, or GI/GU sxs.    Outpatient Medications Prior to Visit  Medication Sig Dispense Refill  . acetaZOLAMIDE (DIAMOX) 500 MG capsule Take 1 capsule (500 mg total) by mouth 2 (two) times daily. 180 capsule 3  . levonorgestrel (MIRENA) 20 MCG/24HR IUD 1 each by Intrauterine route once. Implanted November 2017    . Multiple Vitamin (MULTIVITAMIN WITH MINERALS) TABS tablet Take 1 tablet by mouth daily.    . phentermine 15 MG capsule Take 1 capsule (15 mg total) by mouth every morning. 30 capsule 0   No facility-administered medications prior to visit.      ROS Review of Systems  Constitutional: Negative for chills, fever and malaise/fatigue.  Eyes: Negative for blurred vision.  Respiratory: Negative for shortness of breath.   Cardiovascular: Negative for chest pain and palpitations.  Gastrointestinal: Negative for abdominal pain and nausea.  Genitourinary: Negative for dysuria and hematuria.  Musculoskeletal: Negative for joint pain and myalgias.  Skin: Negative for rash.  Neurological: Negative for tingling and headaches.  Psychiatric/Behavioral: Negative for depression. The patient is not nervous/anxious.     Objective:  BP 128/81 (BP Location: Left Arm, Patient Position:  Sitting, Cuff Size: Normal)   Pulse 60   Temp 97.8 F (36.6 C) (Oral)   Ht 5' 4.5" (1.638 m)   Wt 183 lb (83 kg)   SpO2 92%   BMI 30.93 kg/m   BP/Weight 11/13/2018 10/16/2018 07/10/2018  Systolic BP 128 122 113  Diastolic BP 81 77 73  Wt. (Lbs) 183 184.4 183.2  BMI 30.93 31.16 30.96      Physical Exam Vitals signs reviewed.  Constitutional:      Comments: Well developed, overweight, NAD, polite  HENT:     Head: Normocephalic and atraumatic.  Eyes:     General: No scleral icterus. Neck:     Musculoskeletal: Normal range of motion and neck supple.     Thyroid: No thyromegaly.  Cardiovascular:     Rate and Rhythm: Normal rate and regular rhythm.     Heart sounds: Normal heart sounds.     Comments: No LE edema bilaterally Pulmonary:     Effort: Pulmonary effort is normal.     Breath sounds: Normal breath sounds.  Skin:    General: Skin is warm and dry.     Coloration: Skin is not pale.     Findings: No erythema or rash.  Neurological:     Mental Status: She is alert and oriented to person, place, and time.  Psychiatric:        Behavior: Behavior normal.        Thought Content: Thought content normal.      Assessment & Plan:   1. Class 1 obesity with body mass index (BMI) of 31.0 to 31.9 in adult, unspecified obesity type, unspecified  whether serious comorbidity present - Referral to Nutrition and Diabetes Services - Stop phentermine 15 mg due to mild increase in blood pressure and due to lack of efficacy. Pt has pseudomotor cerebri and has not lost enough weight to justify the use of phentermine. I have advised patient to develop self discipline, a healthy diet, and regular exercise in order to achieve her goals. I have also provided information to Dr. Verdene Rio in [place] that will be willing to see self pay patients for obesity.    Follow-up: Return if symptoms worsen or fail to improve.   Loletta Specter PA

## 2018-12-01 ENCOUNTER — Encounter: Payer: Self-pay | Attending: *Deleted | Admitting: Registered"

## 2018-12-01 DIAGNOSIS — Z713 Dietary counseling and surveillance: Secondary | ICD-10-CM | POA: Insufficient documentation

## 2018-12-01 NOTE — Progress Notes (Signed)
Medical Nutrition Therapy:  Appt start time: 1055 end time:  1155.  This patient is accompanied in the office by her interpreter.  Assessment:  Primary concerns today: Pt states she would like to know how she should be eating to help her lose weight.  Pt reports that she needs to drink tea to be able to have a bowel movement. Pt states she east beans 1x week. Pt states she enjoys vegetables. Pt states her main meal is in the afternoon because that is when she is the busiest.  Sleep: try ~8 hrs. Has a regular sleep schedule due to children: 10 pm- 6:30 am. Wants to keep up with 16 year old child.  Energy: 7-8/10. Sometimes feels it is not enough energy. Tries to rest, may drink green tea.  Preferred Learning Style:   No preference indicated   Learning Readiness:   Ready  MEDICATIONS: reviewed. Pt has IUD for birth control, no menstrual cycle concern. Pt states phentermine caused increase blood pressure.   DIETARY INTAKE:  24-hr recall:  B ( AM): coffee, 4-5 tsp Natural Bliss creamer (not hungry for food)  Snk ( AM): apple OR celery, carrots, ranch  L (1:30 PM): tacos OR omelette OR sandwich Snk ( PM): none D ( PM): beans, pork, broth, pasta, tortilla OR baked chicken, rice OR cabbage, carrots, peppers Snk ( PM): tea Beverages: tea, coffee, 2-3 times per week sweet tea or soda, coconut water with alovera diluted.  Usual physical activity: in summer will walk everyday. Now that it is cold ADLs. Pt states she enjoys doing xbox activity with kids but machine is broken.  Estimated energy needs: 1800 calories  Progress Towards Goal(s):  New goal.   Nutritional Diagnosis:  NI-5.8.5 Inadeqate fiber intake As related to consuming limted amt of foods that are good fiber source.  As evidenced by dietary recall and difficulty staying regular..    Intervention:  Nutrition Education. Discussed principles of MyPlate. Discussed Discussed sugar content of beverages. Discussed other things that  also affect body size. Discussed value of exercise beyond weight control including added mental health benefits. Discussed role of fiber.  Plan: Consider finding ways to move Nia Derinda Late is a fun way to move Think about getting a new xbox to do just dance Aim to eat balanced meals and snack More beans and vegetables to help stay regular Eat mindfully and listen to your hunger and fullness cues Rethink your drink, herbal tea, coffee, water are the best choices  Teaching Method Utilized:  Visual Auditory  Handouts given during visit include:  MyPlate (Spanish)  Rethink Drink (Spanish)  Barriers to learning/adherence to lifestyle change: none  Demonstrated degree of understanding via:  Teach Back   Monitoring/Evaluation:  Dietary intake, exercise, and body weight in 4 week(s).

## 2018-12-01 NOTE — Patient Instructions (Signed)
Consider finding ways to move Savannah [name] is a fun way to move Think about getting a new xbox to do just dance Aim to eat balanced meals and snack More beans and vegetables to help stay regular Eat mindfully and listen to your hunger and fullness cues Rethink your drink, herbal tea, coffee, water are the best choices

## 2019-01-12 ENCOUNTER — Ambulatory Visit: Payer: Self-pay | Admitting: Registered"

## 2019-05-18 MED FILL — MELOXICAM 15 MG TABLET: 15 | 30 days supply | Qty: 30 | Fill #1

## 2019-06-04 MED FILL — MELOXICAM 15 MG TABLET: 15 | 30 days supply | Qty: 30 | Fill #1

## 2019-07-12 ENCOUNTER — Ambulatory Visit: Payer: Self-pay | Admitting: Neurology

## 2019-07-31 ENCOUNTER — Other Ambulatory Visit: Payer: Self-pay

## 2019-07-31 ENCOUNTER — Encounter (HOSPITAL_COMMUNITY): Payer: Self-pay

## 2019-07-31 ENCOUNTER — Ambulatory Visit (HOSPITAL_COMMUNITY)
Admission: EM | Admit: 2019-07-31 | Discharge: 2019-07-31 | Disposition: A | Discharge status: Home / Self Care | Payer: Self-pay | Attending: Emergency Medicine | Admitting: Emergency Medicine

## 2019-07-31 DIAGNOSIS — M5442 Lumbago with sciatica, left side: Secondary | ICD-10-CM

## 2019-07-31 MED ORDER — MELOXICAM 7.5 MG PO TABS: 7.5000 mg | ORAL_TABLET | Freq: Every day | ORAL | 0 refills | Status: DC

## 2019-07-31 MED ORDER — CYCLOBENZAPRINE HCL 5 MG PO TABS: 5.0000 mg | ORAL_TABLET | Freq: Every day | ORAL | 0 refills | Status: DC

## 2019-07-31 MED ORDER — PREDNISONE 20 MG PO TABS: 40.0000 mg | ORAL_TABLET | Freq: Every day | ORAL | 0 refills | Status: AC

## 2019-07-31 NOTE — ED Triage Notes (Signed)
Pt presents with pain in her left leg x 3 weeks. States it starts in her lower back and radiates down her leg. Ambulatory.

## 2019-07-31 NOTE — Discharge Instructions (Signed)
Light and regular activity as tolerated.  See exercises provided.  Heat application while active can help with muscle spasms.  Sleep with pillow under your knees.   Flexeril at night as a muscle relaxer. May cause drowsiness. Please do not take if driving or drinking alcohol.   Prednisone x 5days. At completion may then take meloxicam daily if still with pain. Take with food.  Please follow up with your primary care provider for further evaluation if symptoms persist as you may benefit from treatment.

## 2019-07-31 NOTE — ED Provider Notes (Signed)
[facility]    CSN: [id number] Arrival date & time: 07/31/19  1439      History   Chief Complaint Chief Complaint  Patient presents with  . Sciatica    HPI Savannah [name] is a 33 y.o. female.   Vanice SarahGricelda N [name] presents with complaints of left sided low back pain which radiates down posterior left left. Pain is worse with certain movements and position changes as well as movement when laying down trying to sleep. No saddle symptoms. No loss of bladder or bowel control. States history  Of stress incontinence but this is unchanged. No numbness tingling or weakness. History  Of sciatica to same side and treatment did help. Follows with her PCP. She works at home, laundry triggers symptoms, for example, but no specific injury. Has taken tylenol intermittently which hasn't helped. History  Of anxiety, pseudotumor cerebri.    ROS per HPI, negative if not otherwise mentioned.      Past Medical History:  Diagnosis Date  . Anxiety   . Pseudotumor cerebri 04/03/2017    Patient Active Problem List   Diagnosis Date Noted  . Nutritional counseling 12/01/2018  . Pseudotumor cerebri 04/03/2017  . Normal delivery 01/09/2013    Past Surgical History:  Procedure Laterality Date  . NO PAST SURGERIES      OB History    Gravida  3   Para  3   Term  3   Preterm  0   AB  0   Living  3     SAB  0   TAB  0   Ectopic  0   Multiple  0   Live Births  3            Home Medications    Prior to Admission medications   Medication Sig Start Date End Date Taking? Authorizing Provider  levonorgestrel (MIRENA) 20 MCG/24HR IUD 1 each by Intrauterine route once. Implanted November 2017   Yes [provider]  Multiple Vitamin (MULTIVITAMIN WITH MINERALS) TABS tablet Take 1 tablet by mouth daily.   Yes [provider]  acetaZOLAMIDE (DIAMOX) 500 MG capsule Take 1 capsule (500 mg total) by mouth 2 (two) times daily. 07/08/18    York SpanielWillis, Charles K, MD  cyclobenzaprine (FLEXERIL) 5 MG tablet Take 1 tablet (5 mg total) by mouth at bedtime. 07/31/19   Georgetta HaberBurky, Natalie B, NP  meloxicam (MOBIC) 7.5 MG tablet Take 1 tablet (7.5 mg total) by mouth daily. 08/05/19   Georgetta HaberBurky, Natalie B, NP  phentermine 15 MG capsule Take 1 capsule (15 mg total) by mouth every morning. Patient not taking: Reported on 12/01/2018 10/16/18   Loletta SpecterGomez, Roger David, PA-C  predniSONE (DELTASONE) 20 MG tablet Take 2 tablets (40 mg total) by mouth daily with breakfast for 5 days. 07/31/19 08/05/19  Georgetta HaberBurky, Natalie B, NP    Family History Family History  Problem Relation Age of Onset  . Diabetes Mother   . Diabetes Father   . High blood pressure Father     Social History Social History   Tobacco Use  . Smoking status: Never Smoker  . Smokeless tobacco: Never Used  Substance Use Topics  . Alcohol use: No  . Drug use: No     Allergies   Patient has no known allergies.   Review of Systems Review of Systems   Physical Exam Triage Vital Signs ED Triage Vitals  Enc Vitals Group     BP 07/31/19 1516 125/80  Pulse Rate 07/31/19 1516 62     Resp 07/31/19 1516 19     Temp 07/31/19 1516 98.9 F (37.2 C)     Temp src --      SpO2 07/31/19 1516 100 %     Weight --      Height --      Head Circumference --      Peak Flow --      Pain Score 07/31/19 1514 8     Pain Loc --      Pain Edu? --      Excl. in [place]? --    No data found.  Updated Vital Signs BP 125/80   Pulse 62   Temp 98.9 F (37.2 C)   Resp 19   SpO2 100%   Visual Acuity Right Eye Distance:   Left Eye Distance:   Bilateral Distance:    Right Eye Near:   Left Eye Near:    Bilateral Near:     Physical Exam Constitutional:      General: She is not in acute distress.    Appearance: She is well-developed.  Cardiovascular:     Rate and Rhythm: Normal rate.  Pulmonary:     Effort: Pulmonary effort is normal.  Musculoskeletal:     Lumbar back: She exhibits tenderness  and pain. She exhibits no bony tenderness, no swelling, no edema, no deformity and no laceration.       Back:     Comments: Left low back with tenderness; no spinous process tenderness; no step off or deformity to spinous processes ; strength equal bilaterally; gross sensation intact to lower extremities; pain with transition from sit to lay and lay to sit; positive left straight leg raise   Skin:    General: Skin is warm and dry.  Neurological:     Mental Status: She is alert and oriented to person, place, and time.      UC Treatments / Results  Labs (all labs ordered are listed, but only abnormal results are displayed) Labs Reviewed - No data to display  EKG   Radiology No results found.  Procedures Procedures (including critical care time)  Medications Ordered in UC Medications - No data to display  Initial Impression / Assessment and Plan / UC Course  I have reviewed the triage vital signs and the nursing notes.  Pertinent labs & imaging results that were available during my care of the patient were reviewed by me and considered in my medical decision making (see chart for details).     Exam consistent with sciatica. Prednisone x5 days with flexeril, followed by daily mobic. Exercises provided. Follow up with PCP as needed for persistent symptoms. Return precautions provided. Patient verbalized understanding and agreeable to plan. Ambulatory out of clinic without difficulty.    Final Clinical Impressions(s) / UC Diagnoses   Final diagnoses:  Acute left-sided low back pain with left-sided sciatica     Discharge Instructions     Light and regular activity as tolerated.  See exercises provided.  Heat application while active can help with muscle spasms.  Sleep with pillow under your knees.   Flexeril at night as a muscle relaxer. May cause drowsiness. Please do not take if driving or drinking alcohol.   Prednisone x 5days. At completion may then take meloxicam  daily if still with pain. Take with food.  Please follow up with your primary care provider for further evaluation if symptoms persist as you may benefit from treatment.  ED Prescriptions    Medication Sig Dispense Auth. Provider   predniSONE (DELTASONE) 20 MG tablet Take 2 tablets (40 mg total) by mouth daily with breakfast for 5 days. 10 tablet Linus Mako B, NP   meloxicam (MOBIC) 7.5 MG tablet Take 1 tablet (7.5 mg total) by mouth daily. 20 tablet Linus Mako B, NP   cyclobenzaprine (FLEXERIL) 5 MG tablet Take 1 tablet (5 mg total) by mouth at bedtime. 15 tablet Georgetta Haber, NP     PDMP not reviewed this encounter.   Georgetta Haber, NP 07/31/19 1556

## 2019-09-14 ENCOUNTER — Encounter (INDEPENDENT_AMBULATORY_CARE_PROVIDER_SITE_OTHER): Payer: Self-pay | Admitting: Primary Care

## 2019-09-14 ENCOUNTER — Ambulatory Visit (INDEPENDENT_AMBULATORY_CARE_PROVIDER_SITE_OTHER): Payer: Self-pay | Admitting: Primary Care

## 2019-09-14 ENCOUNTER — Other Ambulatory Visit: Payer: Self-pay

## 2019-09-14 VITALS — BP 123/80 | HR 67 | Temp 97.3°F | Ht 64.5 in | Wt 186.8 lb

## 2019-09-14 DIAGNOSIS — Z23 Encounter for immunization: Secondary | ICD-10-CM

## 2019-09-14 DIAGNOSIS — Z09 Encounter for follow-up examination after completed treatment for conditions other than malignant neoplasm: Secondary | ICD-10-CM

## 2019-09-14 DIAGNOSIS — M5442 Lumbago with sciatica, left side: Secondary | ICD-10-CM

## 2019-09-14 DIAGNOSIS — M549 Dorsalgia, unspecified: Secondary | ICD-10-CM

## 2019-09-14 DIAGNOSIS — M5430 Sciatica, unspecified side: Secondary | ICD-10-CM

## 2019-09-14 MED ORDER — IBUPROFEN 800 MG PO TABS: 600.0000 mg | ORAL_TABLET | Freq: Three times a day (TID) | ORAL | 1 refills | Status: DC | PRN

## 2019-09-14 MED ORDER — CYCLOBENZAPRINE HCL 10 MG PO TABS: 10.0000 mg | ORAL_TABLET | Freq: Three times a day (TID) | ORAL | 1 refills | Status: DC | PRN

## 2019-09-14 NOTE — Patient Instructions (Signed)
Dolor de espalda crnico Chronic Back Pain Cuando el dolor de espalda dura ms de 3 meses se denomina dolor de espalda crnico. Es posible que se desconozca la causa de su dolor de espalda. Algunas causas frecuentes son las siguientes:  Holiday representative (enfermedad degenerativa) de los huesos, los ligamentos o los discos de la espalda.  Inflamacin y rigidez en la espalda (artritis). Las personas que sufren dolor de espalda crnico generalmente atraviesan determinados perodos en los que este es ms intenso (episodios de exacerbacin del Social research officer, government). Muchas personas pueden aprender a Financial controller de espalda con el cuidado en Engineer, mining. Sigue estas instrucciones en tu casa: Est atento a cualquier cambio en los sntomas. Tome estas medidas para Theatre stage manager dolor: Actividad   Evite agacharse y Optometrist otras actividades que agraven el problema.  Mantenga una postura correcta mientras est de pie o sentado: ? Cuando est de pie, mantenga la parte alta de la espalda y el cuello rectos, con los hombros [place]. Evite encorvarse. ? Cuando est sentado, mantenga la espalda recta y relaje los hombros. No curve los hombros ni los eche [place].  No permanezca sentado o de pie en el mismo lugar durante mucho tiempo.  Durante el da, descanse durante lapsos breves. Esto le Best boy. Descansar recostado o de pie suele ser mejor que hacerlo sentado.  Cuando descanse durante perodos ms largos, incorpore alguna [place] o ejercicios de elongacin entre uno y [place]. Esto ayudar a Mining engineer rigidez y Conservation officer, historic buildings.  Hacer ejercicio con regularidad. Pregntele al mdico qu actividades son seguras para usted.  No levante ningn objeto que pese ms de 10libras (4.5kg). Siempre use las tcnicas correctas para levantar objetos, entre ellas: ? Flexionar las rodillas. ? Mantener la carga cerca del cuerpo. ? No torcerse.  Duerma sobre un colchn firme en una posicin cmoda. Intente acostarse de  costado, con las rodillas ligeramente flexionadas. Si se recuesta [employer], coloque una almohada debajo de las rodillas. Control del dolor  Si se lo indican, aplique hielo sobre la zona dolorida. El mdico puede recomendarle que se aplique hielo durante las primeras 24a 48horas despus del comienzo de un episodio de exacerbacin del dolor. ? Ponga el hielo en una bolsa plstica. ? Coloque una [employer] piel y Therapist, nutritional. ? Coloque el hielo durante 56minutos, 2a3veces al da.  Si se lo indican, aplique calor en la zona afectada con la frecuencia que le haya indicado el mdico. Use la fuente de calor que el mdico le recomiende, como una compresa de calor hmedo o una almohadilla trmica. ? Colquese una [employer] piel y la fuente de Freight forwarder. ? Aplique calor durante 20 a 57minutos. ? Retire la fuente de calor si la piel se pone de color rojo brillante. Esto es especialmente importante si no puede sentir dolor, calor o fro. Puede correr un riesgo mayor de sufrir quemaduras.  Intente tomar un bao de inmersin con agua caliente.  Toma los medicamentos de venta libre y los recetados solamente como se lo haya indicado el mdico.  Concurrir a todas las visitas de seguimiento como se lo haya indicado el mdico. Esto es importante. Comuncate con un mdico si:  Siente un dolor que no se alivia con reposo o medicamentos. Solicite ayuda inmediatamente si:  Siente debilidad o adormecimiento en una o ambas piernas, o en uno o ambos pies.  Tiene dificultad para controlar la miccin o la defecacin.  Tiene nuseas o vmitos.  Tiene dolor  en el abdomen.  Le falta el aire o se desmaya. Esta informacin no tiene Theme park manager el consejo del mdico. Asegrese de hacerle al mdico cualquier pregunta que tenga. Document Released: 10/28/2005 Document Revised: 10/30/2018 Document Reviewed: 10/30/2018 Elsevier Patient Education  2020 [employer].

## 2019-09-20 NOTE — Progress Notes (Signed)
Established Patient Office Visit  Subjective:  Patient ID: Savannah [name], female    DOB: 1986/03/25  Age: 33 y.o. MRN: [id number]  CC:  Chief Complaint  Patient presents with  . Leg Pain    left leg    HPI Savannah [name] presents for follow up on emergency room visit on 07/31/2019 for Acute left-sided low back pain with left-sided sciatica. She is continuing to complain of left leg pain.  Past Medical History:  Diagnosis Date  . Anxiety   . Pseudotumor cerebri 04/03/2017    Past Surgical History:  Procedure Laterality Date  . NO PAST SURGERIES      Family History  Problem Relation Age of Onset  . Diabetes Mother   . Diabetes Father   . High blood pressure Father     Social History   Socioeconomic History  . Marital status: Single    Spouse name: Not on file  . Number of children: 3  . Years of education: 15  . Highest education level: Not on file  Occupational History  . Not on file  Social Needs  . Financial resource strain: Not on file  . Food insecurity    Worry: Not on file    Inability: Not on file  . Transportation needs    Medical: Not on file    Non-medical: Not on file  Tobacco Use  . Smoking status: Never Smoker  . Smokeless tobacco: Never Used  Substance and Sexual Activity  . Alcohol use: No  . Drug use: No  . Sexual activity: Not Currently  Lifestyle  . Physical activity    Days per week: Not on file    Minutes per session: Not on file  . Stress: Not on file  Relationships  . Social Musician on phone: Not on file    Gets together: Not on file    Attends religious service: Not on file    Active member of club or organization: Not on file    Attends meetings of clubs or organizations: Not on file    Relationship status: Not on file  . Intimate partner violence    Fear of current or ex partner: Not on file    Emotionally abused: Not on file    Physically abused: Not on file    Forced sexual activity:  Not on file  Other Topics Concern  . Not on file  Social History Narrative   Lives with boyfriend and children   Caffeine use: Drinks coffee and tea daily   Soda rare   Right handed    Outpatient Medications Prior to Visit  Medication Sig Dispense Refill  . levonorgestrel (MIRENA) 20 MCG/24HR IUD 1 each by Intrauterine route once. Implanted November 2017    . Multiple Vitamin (MULTIVITAMIN WITH MINERALS) TABS tablet Take 1 tablet by mouth daily.    Marland Kitchen acetaZOLAMIDE (DIAMOX) 500 MG capsule Take 1 capsule (500 mg total) by mouth 2 (two) times daily. 180 capsule 3  . cyclobenzaprine (FLEXERIL) 5 MG tablet Take 1 tablet (5 mg total) by mouth at bedtime. 15 tablet 0  . meloxicam (MOBIC) 7.5 MG tablet Take 1 tablet (7.5 mg total) by mouth daily. 20 tablet 0  . phentermine 15 MG capsule Take 1 capsule (15 mg total) by mouth every morning. (Patient not taking: Reported on 12/01/2018) 30 capsule 0   No facility-administered medications prior to visit.     No Known Allergies  ROS Review of Systems  Musculoskeletal: Positive for back pain.       Travels down leg  All other systems reviewed and are negative.     Objective:    Physical Exam  Constitutional: She is oriented to person, place, and time. She appears well-developed and well-nourished.  HENT:  Head: Normocephalic.  Neck: Neck supple.  Cardiovascular: Normal rate and regular rhythm.  Pulmonary/Chest: Effort normal and breath sounds normal.  Abdominal: Bowel sounds are normal. She exhibits distension.  Musculoskeletal:        General: Tenderness present.     Comments: Left lower back with palpation  Neurological: She is oriented to person, place, and time.  Skin: Skin is warm and dry.  Psychiatric: She has a normal mood and affect. Her behavior is normal. Judgment and thought content normal.    BP 123/80 (BP Location: Right Arm, Patient Position: Sitting, Cuff Size: Normal)   Pulse 67   Temp (!) 97.3 F (36.3 C)  (Temporal)   Ht 5' 4.5" (1.638 m)   Wt 186 lb 12.8 oz (84.7 kg)   SpO2 96%   BMI 31.57 kg/m  Wt Readings from Last 3 Encounters:  09/14/19 186 lb 12.8 oz (84.7 kg)  11/13/18 183 lb (83 kg)  10/16/18 184 lb 6.4 oz (83.6 kg)    There are no preventive care reminders to display for this patient.  Lab Results  Component Value Date   TSH 2.030 04/29/2017   Lab Results  Component Value Date   WBC 7.6 03/31/2017   HGB 14.3 03/31/2017   HCT 41.6 03/31/2017   MCV 90.4 03/31/2017   PLT 235 03/31/2017   Lab Results  Component Value Date   NA 143 05/22/2017   K 3.6 05/22/2017   CO2 16 (L) 05/22/2017   GLUCOSE 81 05/22/2017   BUN 16 05/22/2017   CREATININE 0.98 05/22/2017   BILITOT 0.5 05/22/2017   ALKPHOS 110 05/22/2017   AST 18 05/22/2017   ALT 37 (H) 05/22/2017   PROT 7.6 05/22/2017   ALBUMIN 4.6 05/22/2017   CALCIUM 9.5 05/22/2017   ANIONGAP 6 03/31/2017   Lab Results  Component Value Date   CHOL 115 04/29/2017   Lab Results  Component Value Date   HDL 37 (L) 04/29/2017   Lab Results  Component Value Date   LDLCALC 56 04/29/2017   Lab Results  Component Value Date   TRIG 109 04/29/2017   Lab Results  Component Value Date   CHOLHDL 3.1 04/29/2017   No results found for: HGBA1C    Assessment & Plan:  Savannah [name] was seen today for leg pain.  Diagnoses and all orders for this visit:  Back pain with sciatica BACK PAIN  Location: left side Quality: 6/10 Onset: gradual  Worse with: lifting , bending     Better with: rest Radiation: down leg Trauma: none Best sitting/standing/leaning forward: no Red Flags Fecal/urinary incontinence: no Numbness/Weakness: no  Fever/chills/sweats: NO Night pain: YES Unexplained weight loss: NO  No relief with bedrest: some but needs to change positions often  h/o cancer/immunosuppression: NO  Prescribe antispasmodic and NSAIDS  Need for immunization against influenza -     Flu Vaccine QUAD 36+ mos IM  Hospital  discharge follow-up She was discharge on  flexeril at night as a muscle relaxer and Prednisone x 5days. At completion may then take meloxicam daily if still with pain. Take with food.  Please follow up with your primary care provider for further evaluation if symptoms persist as you may benefit  from treatment  Other orders -     ibuprofen (ADVIL) 800 MG tablet; Take 1 tablet (800 mg total) by mouth every 8 (eight) hours as needed for moderate pain. -     cyclobenzaprine (FLEXERIL) 10 MG tablet; Take 1 tablet (10 mg total) by mouth 3 (three) times daily as needed for muscle spasms.     Meds ordered this encounter  Medications  . ibuprofen (ADVIL) 800 MG tablet    Sig: Take 1 tablet (800 mg total) by mouth every 8 (eight) hours as needed for moderate pain.    Dispense:  90 tablet    Refill:  1  . cyclobenzaprine (FLEXERIL) 10 MG tablet    Sig: Take 1 tablet (10 mg total) by mouth 3 (three) times daily as needed for muscle spasms.    Dispense:  60 tablet    Refill:  1    Follow-up: Return if symptoms worsen or fail to improve, for schedule physical 2-3 months and labs.    Kerin Perna, NP

## 2019-12-15 ENCOUNTER — Ambulatory Visit (INDEPENDENT_AMBULATORY_CARE_PROVIDER_SITE_OTHER): Payer: Self-pay | Admitting: Primary Care

## 2019-12-15 ENCOUNTER — Encounter (INDEPENDENT_AMBULATORY_CARE_PROVIDER_SITE_OTHER): Payer: Self-pay | Admitting: Primary Care

## 2019-12-15 ENCOUNTER — Other Ambulatory Visit: Payer: Self-pay

## 2019-12-15 VITALS — BP 120/78 | HR 60 | Temp 97.2°F | Ht 64.5 in | Wt 185.8 lb

## 2019-12-15 DIAGNOSIS — N912 Amenorrhea, unspecified: Secondary | ICD-10-CM

## 2019-12-15 DIAGNOSIS — Z0000 Encounter for general adult medical examination without abnormal findings: Secondary | ICD-10-CM

## 2019-12-15 DIAGNOSIS — E669 Obesity, unspecified: Secondary | ICD-10-CM

## 2019-12-15 DIAGNOSIS — Z6831 Body mass index (BMI) 31.0-31.9, adult: Secondary | ICD-10-CM

## 2019-12-15 NOTE — Progress Notes (Signed)
Established Patient Office Visit  Subjective:  Patient ID: Savannah [name], female    DOB: 09-15-1986  Age: 34 y.o. MRN: [id number]  CC:  Chief Complaint  Patient presents with  . Annual Exam    HPI Savannah [name] presents for annual physical.  Past Medical History:  Diagnosis Date  . Anxiety   . Pseudotumor cerebri 04/03/2017    Past Surgical History:  Procedure Laterality Date  . NO PAST SURGERIES      Family History  Problem Relation Age of Onset  . Diabetes Mother   . Diabetes Father   . High blood pressure Father     Social History   Socioeconomic History  . Marital status: Single    Spouse name: Not on file  . Number of children: 3  . Years of education: 35  . Highest education level: Not on file  Occupational History  . Not on file  Tobacco Use  . Smoking status: Never Smoker  . Smokeless tobacco: Never Used  Substance and Sexual Activity  . Alcohol use: No  . Drug use: No  . Sexual activity: Not Currently  Other Topics Concern  . Not on file  Social History Narrative   Lives with boyfriend and children   Caffeine use: Drinks coffee and tea daily   Soda rare   Right handed   Social Determinants of Health   Financial Resource Strain:   . Difficulty of Paying Living Expenses: Not on file  Food Insecurity:   . Worried About Charity fundraiser in the Last Year: Not on file  . Ran Out of Food in the Last Year: Not on file  Transportation Needs:   . Lack of Transportation (Medical): Not on file  . Lack of Transportation (Non-Medical): Not on file  Physical Activity:   . Days of Exercise per Week: Not on file  . Minutes of Exercise per Session: Not on file  Stress:   . Feeling of Stress : Not on file  Social Connections:   . Frequency of Communication with Friends and Family: Not on file  . Frequency of Social Gatherings with Friends and Family: Not on file  . Attends Religious Services: Not on file  . Active Member of  Clubs or Organizations: Not on file  . Attends Archivist Meetings: Not on file  . Marital Status: Not on file  Intimate Partner Violence:   . Fear of Current or Ex-Partner: Not on file  . Emotionally Abused: Not on file  . Physically Abused: Not on file  . Sexually Abused: Not on file    Outpatient Medications Prior to Visit  Medication Sig Dispense Refill  . levonorgestrel (MIRENA) 20 MCG/24HR IUD 1 each by Intrauterine route once. Implanted November 2017    . Multiple Vitamin (MULTIVITAMIN WITH MINERALS) TABS tablet Take 1 tablet by mouth daily.    Marland Kitchen ibuprofen (ADVIL) 800 MG tablet Take 1 tablet (800 mg total) by mouth every 8 (eight) hours as needed for moderate pain. 90 tablet 1  . cyclobenzaprine (FLEXERIL) 10 MG tablet Take 1 tablet (10 mg total) by mouth 3 (three) times daily as needed for muscle spasms. 60 tablet 1   No facility-administered medications prior to visit.    No Known Allergies  ROS Review of Systems  Constitutional: Positive for fever.  Genitourinary: Positive for pelvic pain.  All other systems reviewed and are negative.     Objective:    Physical Exam  Constitutional: She  is oriented to person, place, and time. She appears well-developed and well-nourished.  HENT:  Head: Normocephalic.  Eyes: Pupils are equal, round, and reactive to light. EOM are normal.  Cardiovascular: Normal rate and regular rhythm.  Abdominal: Soft. Bowel sounds are normal. She exhibits distension.  Musculoskeletal:        General: Normal range of motion.     Cervical back: Normal range of motion and neck supple.  Neurological: She is oriented to person, place, and time. She has normal reflexes.  Skin: Skin is warm and dry.  Mole elevated left side of neck  Psychiatric: She has a normal mood and affect.    BP 120/78 (BP Location: Right Arm, Patient Position: Sitting, Cuff Size: Normal)   Pulse 60   Temp (!) 97.2 F (36.2 C) (Temporal)   Ht 5' 4.5" (1.638 m)    Wt 185 lb 12.8 oz (84.3 kg)   SpO2 97%   BMI 31.40 kg/m  Wt Readings from Last 3 Encounters:  12/15/19 185 lb 12.8 oz (84.3 kg)  09/14/19 186 lb 12.8 oz (84.7 kg)  11/13/18 183 lb (83 kg)     There are no preventive care reminders to display for this patient.  There are no preventive care reminders to display for this patient.  Lab Results  Component Value Date   TSH 2.030 04/29/2017   Lab Results  Component Value Date   WBC 7.6 03/31/2017   HGB 14.3 03/31/2017   HCT 41.6 03/31/2017   MCV 90.4 03/31/2017   PLT 235 03/31/2017   Lab Results  Component Value Date   NA 143 05/22/2017   K 3.6 05/22/2017   CO2 16 (L) 05/22/2017   GLUCOSE 81 05/22/2017   BUN 16 05/22/2017   CREATININE 0.98 05/22/2017   BILITOT 0.5 05/22/2017   ALKPHOS 110 05/22/2017   AST 18 05/22/2017   ALT 37 (H) 05/22/2017   PROT 7.6 05/22/2017   ALBUMIN 4.6 05/22/2017   CALCIUM 9.5 05/22/2017   ANIONGAP 6 03/31/2017   Lab Results  Component Value Date   CHOL 115 04/29/2017   Lab Results  Component Value Date   HDL 37 (L) 04/29/2017   Lab Results  Component Value Date   LDLCALC 56 04/29/2017   Lab Results  Component Value Date   TRIG 109 04/29/2017   Lab Results  Component Value Date   CHOLHDL 3.1 04/29/2017   No results found for: HGBA1C    Assessment & Plan:  Savannah [name] was seen today for annual exam.  Diagnoses and all orders for this visit:  Annual physical exam No care gaps to address.  -     CMP14+EGFR  Amenorrhea Secondary to IUD -     CBC with Differential  Class 1 obesity with body mass index (BMI) of 31.0 to 31.9 in adult, unspecified obesity type, unspecified whether serious comorbidity present Obesity is 30-39 indicating an excess in caloric intake or underlining conditions. This may lead to other co-morbidities examples hypertension, diabetes respiratory problems and CVD. Lifestyle modifications of diet and exercise may reduce obesity.  -     Lipid Panel -      TSH + free T4    Follow-up: Return if symptoms worsen or fail to improve.    Kerin Perna, NP

## 2019-12-15 NOTE — Patient Instructions (Signed)
Health Maintenance, Female Adopting a healthy lifestyle and getting preventive care are important in promoting health and wellness. Ask your health care provider about:  The right schedule for you to have regular tests and exams.  Things you can do on your own to prevent diseases and keep yourself healthy. What should I know about diet, weight, and exercise? Eat a healthy diet   Eat a diet that includes plenty of vegetables, fruits, low-fat dairy products, and lean protein.  Do not eat a lot of foods that are high in solid fats, added sugars, or sodium. Maintain a healthy weight Body mass index (BMI) is used to identify weight problems. It estimates body fat based on height and weight. Your health care provider can help determine your BMI and help you achieve or maintain a healthy weight. Get regular exercise Get regular exercise. This is one of the most important things you can do for your health. Most adults should:  Exercise for at least 150 minutes each week. The exercise should increase your heart rate and make you sweat (moderate-intensity exercise).  Do strengthening exercises at least twice a week. This is in addition to the moderate-intensity exercise.  Spend less time sitting. Even light physical activity can be beneficial. Watch cholesterol and blood lipids Have your blood tested for lipids and cholesterol at 36 years of age, then have this test every 5 years. Have your cholesterol levels checked more often if:  Your lipid or cholesterol levels are high.  You are older than 36 years of age.  You are at high risk for heart disease. What should I know about cancer screening? Depending on your health history and family history, you may need to have cancer screening at various ages. This may include screening for:  Breast cancer.  Cervical cancer.  Colorectal cancer.  Skin cancer.  Lung cancer. What should I know about heart disease, diabetes, and high blood  pressure? Blood pressure and heart disease  High blood pressure causes heart disease and increases the risk of stroke. This is more likely to develop in people who have high blood pressure readings, are of African descent, or are overweight.  Have your blood pressure checked: ? Every 3-5 years if you are 18-39 years of age. ? Every year if you are 40 years old or older. Diabetes Have regular diabetes screenings. This checks your fasting blood sugar level. Have the screening done:  Once every three years after age 40 if you are at a normal weight and have a low risk for diabetes.  More often and at a younger age if you are overweight or have a high risk for diabetes. What should I know about preventing infection? Hepatitis B If you have a higher risk for hepatitis B, you should be screened for this virus. Talk with your health care provider to find out if you are at risk for hepatitis B infection. Hepatitis C Testing is recommended for:  Everyone born from 1945 through 1965.  Anyone with known risk factors for hepatitis C. Sexually transmitted infections (STIs)  Get screened for STIs, including gonorrhea and chlamydia, if: ? You are sexually active and are younger than 36 years of age. ? You are older than 36 years of age and your health care provider tells you that you are at risk for this type of infection. ? Your sexual activity has changed since you were last screened, and you are at increased risk for chlamydia or gonorrhea. Ask your health care provider if   you are at risk.  Ask your health care provider about whether you are at high risk for HIV. Your health care provider may recommend a prescription medicine to help prevent HIV infection. If you choose to take medicine to prevent HIV, you should first get tested for HIV. You should then be tested every 3 months for as long as you are taking the medicine. Pregnancy  If you are about to stop having your period (premenopausal) and  you may become pregnant, seek counseling before you get pregnant.  Take 400 to 800 micrograms (mcg) of folic acid every day if you become pregnant.  Ask for birth control (contraception) if you want to prevent pregnancy. Osteoporosis and menopause Osteoporosis is a disease in which the bones lose minerals and strength with aging. This can result in bone fractures. If you are 65 years old or older, or if you are at risk for osteoporosis and fractures, ask your health care provider if you should:  Be screened for bone loss.  Take a calcium or vitamin D supplement to lower your risk of fractures.  Be given hormone replacement therapy (HRT) to treat symptoms of menopause. Follow these instructions at home: Lifestyle  Do not use any products that contain nicotine or tobacco, such as cigarettes, e-cigarettes, and chewing tobacco. If you need help quitting, ask your health care provider.  Do not use street drugs.  Do not share needles.  Ask your health care provider for help if you need support or information about quitting drugs. Alcohol use  Do not drink alcohol if: ? Your health care provider tells you not to drink. ? You are pregnant, may be pregnant, or are planning to become pregnant.  If you drink alcohol: ? Limit how much you use to 0-1 drink a day. ? Limit intake if you are breastfeeding.  Be aware of how much alcohol is in your drink. In the [place] one drink equals one 12 oz bottle of beer (355 mL), one 5 oz glass of wine (148 mL), or one 1 oz glass of hard liquor (44 mL). General instructions  Schedule regular health, dental, and eye exams.  Stay current with your vaccines.  Tell your health care provider if: ? You often feel depressed. ? You have ever been abused or do not feel safe at home. Summary  Adopting a healthy lifestyle and getting preventive care are important in promoting health and wellness.  Follow your health care provider's instructions about healthy  diet, exercising, and getting tested or screened for diseases.  Follow your health care provider's instructions on monitoring your cholesterol and blood pressure. This information is not intended to replace advice given to you by your health care provider. Make sure you discuss any questions you have with your health care provider. Document Revised: 10/21/2018 Document Reviewed: 10/21/2018 Elsevier Patient Education  2020 [employer] Inc[employer]  

## 2019-12-16 LAB — CBC WITH DIFFERENTIAL/PLATELET
Basophils Absolute: 0 10*3/uL (ref 0.0–0.2)
Basos: 0 %
EOS (ABSOLUTE): 0.1 10*3/uL (ref 0.0–0.4)
Eos: 1 %
Hematocrit: 42 % (ref 34.0–46.6)
Hemoglobin: 14.7 g/dL (ref 11.1–15.9)
Immature Grans (Abs): 0 10*3/uL (ref 0.0–0.1)
Immature Granulocytes: 0 %
Lymphocytes Absolute: 2.3 10*3/uL (ref 0.7–3.1)
Lymphs: 34 %
MCH: 31.8 pg (ref 26.6–33.0)
MCHC: 35 g/dL (ref 31.5–35.7)
MCV: 91 fL (ref 79–97)
Monocytes Absolute: 0.4 10*3/uL (ref 0.1–0.9)
Monocytes: 6 %
Neutrophils Absolute: 3.9 10*3/uL (ref 1.4–7.0)
Neutrophils: 59 %
Platelets: 237 10*3/uL (ref 150–450)
RBC: 4.62 x10E6/uL (ref 3.77–5.28)
RDW: 12.5 % (ref 11.7–15.4)
WBC: 6.7 10*3/uL (ref 3.4–10.8)

## 2019-12-16 LAB — CMP14+EGFR
ALT: 18 IU/L (ref 0–32)
AST: 14 IU/L (ref 0–40)
Albumin/Globulin Ratio: 1.8 (ref 1.2–2.2)
Albumin: 4.6 g/dL (ref 3.8–4.8)
Alkaline Phosphatase: 115 IU/L (ref 39–117)
BUN/Creatinine Ratio: 19 (ref 9–23)
BUN: 14 mg/dL (ref 6–20)
Bilirubin Total: 0.4 mg/dL (ref 0.0–1.2)
CO2: 24 mmol/L (ref 20–29)
Calcium: 9.5 mg/dL (ref 8.7–10.2)
Chloride: 105 mmol/L (ref 96–106)
Creatinine, Ser: 0.72 mg/dL (ref 0.57–1.00)
GFR calc Af Amer: 125 mL/min/{1.73_m2} (ref >59)
GFR calc non Af Amer: 109 mL/min/{1.73_m2} (ref >59)
Globulin, Total: 2.5 g/dL (ref 1.5–4.5)
Glucose: 99 mg/dL (ref 65–99)
Potassium: 4.3 mmol/L (ref 3.5–5.2)
Sodium: 140 mmol/L (ref 134–144)
Total Protein: 7.1 g/dL (ref 6.0–8.5)

## 2019-12-16 LAB — LIPID PANEL
Chol/HDL Ratio: 3.4 ratio (ref 0.0–4.4)
Cholesterol, Total: 148 mg/dL (ref 100–199)
HDL: 44 mg/dL (ref >39)
LDL Chol Calc (NIH): 85 mg/dL (ref 0–99)
Triglycerides: 106 mg/dL (ref 0–149)
VLDL Cholesterol Cal: 19 mg/dL (ref 5–40)

## 2019-12-16 LAB — TSH+FREE T4
Free T4: 1.11 ng/dL (ref 0.82–1.77)
TSH: 1.42 u[IU]/mL (ref 0.450–4.500)

## 2019-12-17 ENCOUNTER — Telehealth (INDEPENDENT_AMBULATORY_CARE_PROVIDER_SITE_OTHER): Payer: Self-pay

## 2019-12-17 NOTE — Telephone Encounter (Signed)
Call placed to patient using pacific interpreter [phone]) left detailed voicemail per DPR informing patient that labs are normal. Call [facility] at [phone] with any questions or concerns. Maryjean Morn, CMA

## 2019-12-17 NOTE — Telephone Encounter (Signed)
-----   Message from Grayce Sessions, NP sent at 12/16/2019  4:18 PM EST ----- Labs are normal.

## 2020-12-06 ENCOUNTER — Encounter (HOSPITAL_COMMUNITY): Payer: Self-pay

## 2020-12-06 ENCOUNTER — Emergency Department (HOSPITAL_COMMUNITY)
Admission: EM | Admit: 2020-12-06 | Discharge: 2020-12-06 | Disposition: A | Discharge status: Home / Self Care | Payer: Self-pay | Attending: Emergency Medicine | Admitting: Emergency Medicine

## 2020-12-06 ENCOUNTER — Emergency Department (HOSPITAL_COMMUNITY): Payer: Self-pay

## 2020-12-06 ENCOUNTER — Other Ambulatory Visit: Payer: Self-pay

## 2020-12-06 DIAGNOSIS — N200 Calculus of kidney: Secondary | ICD-10-CM | POA: Insufficient documentation

## 2020-12-06 DIAGNOSIS — K8020 Calculus of gallbladder without cholecystitis without obstruction: Secondary | ICD-10-CM | POA: Insufficient documentation

## 2020-12-06 LAB — URINALYSIS, ROUTINE W REFLEX MICROSCOPIC
Bilirubin Urine: NEGATIVE
Glucose, UA: NEGATIVE mg/dL
Ketones, ur: NEGATIVE mg/dL
Nitrite: NEGATIVE
Protein, ur: NEGATIVE mg/dL
Specific Gravity, Urine: >1.03 — ABNORMAL HIGH (ref 1.005–1.030)
pH: 5 (ref 5.0–8.0)

## 2020-12-06 LAB — URINALYSIS, MICROSCOPIC (REFLEX)

## 2020-12-06 LAB — BASIC METABOLIC PANEL
Anion gap: 11 (ref 5–15)
BUN: 19 mg/dL (ref 6–20)
CO2: 20 mmol/L — ABNORMAL LOW (ref 22–32)
Calcium: 9.2 mg/dL (ref 8.9–10.3)
Chloride: 108 mmol/L (ref 98–111)
Creatinine, Ser: 0.75 mg/dL (ref 0.44–1.00)
GFR, Estimated: >60 mL/min (ref >60)
Glucose, Bld: 146 mg/dL — ABNORMAL HIGH (ref 70–99)
Potassium: 3.6 mmol/L (ref 3.5–5.1)
Sodium: 139 mmol/L (ref 135–145)

## 2020-12-06 LAB — HEPATIC FUNCTION PANEL
ALT: 17 U/L (ref 0–44)
AST: 18 U/L (ref 15–41)
Albumin: 4 g/dL (ref 3.5–5.0)
Alkaline Phosphatase: 82 U/L (ref 38–126)
Bilirubin, Direct: 0.1 mg/dL (ref 0.0–0.2)
Indirect Bilirubin: 0.5 mg/dL (ref 0.3–0.9)
Total Bilirubin: 0.6 mg/dL (ref 0.3–1.2)
Total Protein: 7 g/dL (ref 6.5–8.1)

## 2020-12-06 LAB — CBC
HCT: 41.2 % (ref 36.0–46.0)
Hemoglobin: 13.9 g/dL (ref 12.0–15.0)
MCH: 30 pg (ref 26.0–34.0)
MCHC: 33.7 g/dL (ref 30.0–36.0)
MCV: 89 fL (ref 80.0–100.0)
Platelets: 236 10*3/uL (ref 150–400)
RBC: 4.63 MIL/uL (ref 3.87–5.11)
RDW: 12.3 % (ref 11.5–15.5)
WBC: 10 10*3/uL (ref 4.0–10.5)
nRBC: 0 % (ref 0.0–0.2)

## 2020-12-06 LAB — LIPASE, BLOOD: Lipase: 34 U/L (ref 11–51)

## 2020-12-06 LAB — PREGNANCY, URINE: Preg Test, Ur: NEGATIVE

## 2020-12-06 LAB — POC URINE PREG, ED: Preg Test, Ur: NEGATIVE

## 2020-12-06 MED ORDER — MORPHINE SULFATE (PF) 4 MG/ML IV SOLN
4.0000 mg | Freq: Once | INTRAVENOUS | Status: AC
  Administered 2020-12-06: 4 mg via INTRAVENOUS
  Filled 2020-12-06: qty 1

## 2020-12-06 MED ORDER — SODIUM CHLORIDE 0.9 % IV BOLUS
500.0000 mL | Freq: Once | INTRAVENOUS | Status: AC
  Administered 2020-12-06: 500 mL via INTRAVENOUS

## 2020-12-06 MED ORDER — ONDANSETRON 4 MG PO TBDP: 4.0000 mg | ORAL_TABLET | Freq: Three times a day (TID) | ORAL | 0 refills | Status: DC | PRN

## 2020-12-06 MED ORDER — HYDROCODONE-ACETAMINOPHEN 5-325 MG PO TABS: 1.0000 | ORAL_TABLET | Freq: Four times a day (QID) | ORAL | 0 refills | Status: DC | PRN

## 2020-12-06 MED ORDER — FENTANYL CITRATE (PF) 100 MCG/2ML IJ SOLN
50.0000 ug | Freq: Once | INTRAMUSCULAR | Status: AC
Start: 2020-12-06 — End: 2020-12-06
  Administered 2020-12-06: 50 ug via INTRAVENOUS
  Filled 2020-12-06: qty 2

## 2020-12-06 MED ORDER — ONDANSETRON HCL 4 MG/2ML IJ SOLN
2.0000 mg | Freq: Once | INTRAMUSCULAR | Status: AC
  Administered 2020-12-06: 2 mg via INTRAVENOUS
  Filled 2020-12-06: qty 2

## 2020-12-06 MED ORDER — TAMSULOSIN HCL 0.4 MG PO CAPS: 0.4000 mg | ORAL_CAPSULE | Freq: Every day | ORAL | 0 refills | Status: AC

## 2020-12-06 NOTE — ED Notes (Signed)
DC instructions reviewed with pt. PT verbalized understanding. PT DC °

## 2020-12-06 NOTE — ED Notes (Signed)
Pt transported to CT ?

## 2020-12-06 NOTE — ED Provider Notes (Signed)
[facility] EMERGENCY DEPARTMENT Provider Note   CSN: [id number] Arrival date & time: 12/06/20  5573     History Chief Complaint  Patient presents with  . Flank Pain   Spanish video interpreter used during this visit. Savannah [name] is a 35 y.o. female history of anxiety, pseudotumor cerebri.  Patient arrives today for right flank pain onset early this morning she reports constant sharp pain radiating from her right flank down to her right lower abdomen is been constant no clear aggravating or alleviating factors associated with urinary hesitancy.  Denies history of the same.  Associated with few episodes of nonbloody/nonbilious emesis when pain is severe.  Denies fever/chills, headache, chest pain/shortness of breath, diarrhea, dysuria/hematuria, vaginal bleeding/discharge, concern for STI, saddle or paresthesias, bowel/bladder incontinence, urinary retention, fall/injury or any additional concerns.  HPI     Past Medical History:  Diagnosis Date  . Anxiety   . Pseudotumor cerebri 04/03/2017    Patient Active Problem List   Diagnosis Date Noted  . Nutritional counseling 12/01/2018  . Pseudotumor cerebri 04/03/2017  . Normal delivery 01/09/2013    Past Surgical History:  Procedure Laterality Date  . NO PAST SURGERIES       OB History    Gravida  3   Para  3   Term  3   Preterm  0   AB  0   Living  3     SAB  0   IAB  0   Ectopic  0   Multiple  0   Live Births  3           Family History  Problem Relation Age of Onset  . Diabetes Mother   . Diabetes Father   . High blood pressure Father     Social History   Tobacco Use  . Smoking status: Never Smoker  . Smokeless tobacco: Never Used  Vaping Use  . Vaping Use: Never used  Substance Use Topics  . Alcohol use: No  . Drug use: No    Home Medications Prior to Admission medications   Medication Sig Start Date End Date Taking? Authorizing Provider   HYDROcodone-acetaminophen (NORCO/VICODIN) 5-325 MG tablet Take 1 tablet by mouth every 6 (six) hours as needed for severe pain. 12/06/20  Yes Harlene Salts A, PA-C  ondansetron (ZOFRAN ODT) 4 MG disintegrating tablet Take 1 tablet (4 mg total) by mouth every 8 (eight) hours as needed for nausea or vomiting. 12/06/20  Yes Harlene Salts A, PA-C  tamsulosin (FLOMAX) 0.4 MG CAPS capsule Take 1 capsule (0.4 mg total) by mouth daily for 5 days. 12/06/20 12/11/20 Yes Harlene Salts A, PA-C  ibuprofen (ADVIL) 800 MG tablet Take 1 tablet (800 mg total) by mouth every 8 (eight) hours as needed for moderate pain. 09/14/19   Grayce Sessions, NP  levonorgestrel (MIRENA) 20 MCG/24HR IUD 1 each by Intrauterine route once. Implanted November 2017    [provider]  Multiple Vitamin (MULTIVITAMIN WITH MINERALS) TABS tablet Take 1 tablet by mouth daily.    [provider]    Allergies    Patient has no known allergies.  Review of Systems   Review of Systems Ten systems are reviewed and are negative for acute change except as noted in the HPI  Physical Exam Updated Vital Signs BP (!) 143/92 (BP Location: Right Arm)   Pulse 61   Temp 97.7 F (36.5 C) (Oral)   Resp 16   Ht 5\' 5"  (  1.651 m)   Wt 81.6 kg   SpO2 99%   BMI 29.95 kg/m   Physical Exam Constitutional:      General: She is not in acute distress.    Appearance: Normal appearance. She is well-developed. She is not ill-appearing or diaphoretic.  HENT:     Head: Normocephalic and atraumatic.  Eyes:     General: Vision grossly intact. Gaze aligned appropriately.     Pupils: Pupils are equal, round, and reactive to light.  Neck:     Trachea: Trachea and phonation normal.  Pulmonary:     Effort: Pulmonary effort is normal. No respiratory distress.  Abdominal:     General: There is no distension.     Palpations: Abdomen is soft.     Tenderness: There is no abdominal tenderness. There is right CVA tenderness. There is  no left CVA tenderness, guarding or rebound.  Genitourinary:    Comments: Deferred by patient Musculoskeletal:        General: Normal range of motion.     Cervical back: Normal range of motion.     Comments: No midline C/T/L spinal tenderness to palpation, no deformity, crepitus, or step-off noted.  Skin:    General: Skin is warm and dry.  Neurological:     Mental Status: She is alert.     GCS: GCS eye subscore is 4. GCS verbal subscore is 5. GCS motor subscore is 6.     Comments: Speech is clear and goal oriented, follows commands Major Cranial nerves without deficit, no facial droop Moves extremities without ataxia, coordination intact  Psychiatric:        Behavior: Behavior normal.     ED Results / Procedures / Treatments   Labs (all labs ordered are listed, but only abnormal results are displayed) Labs Reviewed  URINALYSIS, ROUTINE W REFLEX MICROSCOPIC - Abnormal; Notable for the following components:      Result Value   APPearance TURBID (*)    Specific Gravity, Urine >1.030 (*)    Hgb urine dipstick LARGE (*)    Leukocytes,Ua TRACE (*)    All other components within normal limits  BASIC METABOLIC PANEL - Abnormal; Notable for the following components:   CO2 20 (*)    Glucose, Bld 146 (*)    All other components within normal limits  URINALYSIS, MICROSCOPIC (REFLEX) - Abnormal; Notable for the following components:   Bacteria, UA FEW (*)    All other components within normal limits  URINE CULTURE  CBC  LIPASE, BLOOD  HEPATIC FUNCTION PANEL  PREGNANCY, URINE  I-STAT BETA HCG BLOOD, ED ([facility], [facility], [facility] ONLY)  POC URINE PREG, ED    EKG None  Radiology CT Renal Stone Study  Result Date: 12/06/2020 CLINICAL DATA:  RIGHT flank pain and difficulty urinating EXAM: CT ABDOMEN AND PELVIS WITHOUT CONTRAST TECHNIQUE: Multidetector CT imaging of the abdomen and pelvis was performed following the standard protocol without IV contrast. Sagittal and coronal MPR images  reconstructed from axial data set. Oral contrast was not administered for this indication. COMPARISON:  None FINDINGS: Lower chest: Lung bases clear Hepatobiliary: Numerous calculi within gallbladder. Liver unremarkable. Pancreas: Normal appearance Spleen: Normal appearance Adrenals/Urinary Tract: Adrenal glands, LEFT kidney, and LEFT ureter normal appearance. RIGHT hydronephrosis, hydroureter, and RIGHT perinephric edema secondary to a 1-2 mm diameter calculus at the RIGHT ureterovesical junction. Remainder of bladder unremarkable. Stomach/Bowel: Normal appendix. Stomach and bowel loops normal appearance Vascular/Lymphatic: Aorta normal caliber.  No adenopathy. Reproductive: IUD within uterus. Uterus and  ovaries otherwise unremarkable. Other: No free air or free fluid.  No hernia. Musculoskeletal: Osseous structures unremarkable. IMPRESSION: RIGHT hydronephrosis, hydroureter, and perinephric edema secondary to a 1-2 mm diameter calculus at the RIGHT ureterovesical junction. Cholelithiasis. Electronically Signed   By: Ulyses Southward M.D.   On: 12/06/2020 14:46    Procedures Procedures   Medications Ordered in ED Medications  fentaNYL (SUBLIMAZE) injection 50 mcg (50 mcg Intravenous Given 12/06/20 1028)  ondansetron (ZOFRAN) injection 2 mg (2 mg Intravenous Given 12/06/20 1030)  sodium chloride 0.9 % bolus 500 mL (0 mLs Intravenous Stopped 12/06/20 1332)  morphine 4 MG/ML injection 4 mg (4 mg Intravenous Given 12/06/20 1331)    ED Course  I have reviewed the triage vital signs and the nursing notes.  Pertinent labs & imaging results that were available during my care of the patient were reviewed by me and considered in my medical decision making (see chart for details).    MDM Rules/Calculators/A&P                         Additional history obtained from: 1. Nursing notes from this visit. 2. Review of electronic medical records ------------------ I ordered, reviewed and interpreted labs which  include: Pregnancy test negative, doubt ectopic. Lipase normal limits, doubt pancreatitis. LFTs within normal limits, doubt cholecystitis or biliary obstruction. CBC without leukocytosis to suggest bacterial infection, no anemia. BMP shows no emergent electrolyte derangement, AKI or gap. Urinalysis shows hemoglobin, few bacteria, no WBCs or nitrates to suggest infection.  Some yeast present and crystals  CT Renal Stone Study:  IMPRESSION:  RIGHT hydronephrosis, hydroureter, and perinephric edema secondary  to a 1-2 mm diameter calculus at the RIGHT ureterovesical junction.    Cholelithiasis.  ----------- Patient reassessed, pain improved.  Based on size and location showed soon drop in the patient's bladder.  Will prescribe Flomax to help facilitate stone passage.  Zofran to help with nausea.  Norco to help with pain.  PMD P reviewed patient without recent narcotic prescriptions I discussed narcotic precautions with the patient she stated understanding.  Her husband is driving her home today.  I gave patient referral to urology encouraged her to call for follow-up appointment.  Patient was also made aware of incidental cholelithiasis, she is no right upper quadrant tenderness or post prandial pain, I encouraged her to follow-up with her PCP for further evaluation.  Urinalysis sent for culture, no evidence for infection indication for antibiotics at this time.  At this time there does not appear to be any evidence of an acute emergency medical condition and the patient appears stable for discharge with appropriate outpatient follow up. Diagnosis was discussed with patient who verbalizes understanding of care plan and is agreeable to discharge. I have discussed return precautions with patient who verbalizes understanding. Patient encouraged to follow-up with their PCP and Urology. All questions answered.  Patient's case discussed with Dr. Anitra Lauth who agrees with plan to discharge with outpatient  follow-up.   Spanish video interpreter used.  Note: Portions of this report may have been transcribed using voice recognition software. Every effort was made to ensure accuracy; however, inadvertent computerized transcription errors may still be present. Final Clinical Impression(s) / ED Diagnoses Final diagnoses:  Kidney stone  Calculus of gallbladder without cholecystitis without obstruction    Rx / DC Orders ED Discharge Orders         Ordered    tamsulosin (FLOMAX) 0.4 MG CAPS capsule  Daily  12/06/20 1551    ondansetron (ZOFRAN ODT) 4 MG disintegrating tablet  Every 8 hours PRN        12/06/20 1551    HYDROcodone-acetaminophen (NORCO/VICODIN) 5-325 MG tablet  Every 6 hours PRN        12/06/20 1551           Elizabeth Palau 12/06/20 1553    Gwyneth Sprout, MD 12/09/20 [phone]

## 2020-12-06 NOTE — ED Triage Notes (Signed)
Patient reports R sided flank pain and difficulty urinating, reports when she is able to pee it hurts, denies hx of kidney stones

## 2020-12-06 NOTE — Discharge Instructions (Addendum)
At this time there does not appear to be the presence of an emergent medical condition, however there is always the potential for conditions to change. Please read and follow the below instructions.  Please return to the Emergency Department immediately for any new or worsening symptoms or if your symptoms do not improve within 2 days. Please be sure to follow up with your Primary Care Provider within one week regarding your visit today; please call their office to schedule an appointment even if you are feeling better for a follow-up visit. Please take the medication Flomax as prescribed to help facilitate stone passage.  You may use the medication Zofran as prescribed for nausea.  Zofran will dissolve under your tongue so no need to chew or swallow it.  Please drink plenty of water and get plenty of rest.  Your stone is small and should drop into your bladder within the next day or so.  You may call the urologist Dr. Vevelyn Royals office under discharge paperwork to schedule follow-up appointment regarding your stone.  If your symptoms worsen at any time do not wait for your follow-up appointment and instead return to the emergency department. Your CT scan also showed gallstones today.  Please discuss with your primary care provider at your follow-up visit. You may take the medication Norco (Hydrocodone/Acetaminophen) as prescribed to help with severe pain.  This medication will make you drowsy so do not drive, drink alcohol, take other sedating medications or perform any dangerous activities such as driving after taking Norco. Norco contains Tylenol (acetaminophen) so do not take any other Tylenol-containing products with Norco.   Go to the nearest Emergency Department immediately if: You have fever or chills You get very bad pain. You get new pain in your belly (abdomen). You pass out (faint). You cannot pee. You have pain after eating or vomit You have any new/concerning or worsening of  symptoms  Please read the additional information packets attached to your discharge summary.  Do not take your medicine if  develop an itchy rash, swelling in your mouth or lips, or difficulty breathing; call 911 and seek immediate emergency medical attention if this occurs.  You may review your lab tests and imaging results in their entirety on your MyChart account.  Please discuss all results of fully with your primary care provider and other specialist at your follow-up visit.  Note: Portions of this text may have been transcribed using voice recognition software. Every effort was made to ensure accuracy; however, inadvertent computerized transcription errors may still be present. ----------------- Below has been translated using Google translate.  Errors may be present.  Interpret with caution.  A continuacin se ha traducido con Soil scientist. Puede haber errores. Interprete con precaucin. ------------- En este momento no parece haber presencia de una condicin mdica emergente, sin embargo, siempre existe la posibilidad de que las condiciones [place]. Lea y siga las instrucciones a continuacin.  1. Regrese al departamento de emergencias de inmediato por cualquier sntoma nuevo o que empeore o si sus sntomas no mejoran dentro de los 2 [address]  [address]. 2. Asegrese de hacer un seguimiento con su proveedor de atencin primaria dentro de una semana con respecto a su visita de hoy; llame a su oficina para programar una cita, incluso si se siente mejor para una visita de seguimiento. 3. Tome el medicamento Flomax segn lo prescrito para ayudar a Customer service manager de los clculos. Puede usar el medicamento Zofran segn lo prescrito para las nuseas. Zofran se disolver  debajo de la lengua, por lo que no es Hydrologist ni tragarlo. Beba mucha agua y descanse lo suficiente. Su piedra es pequea y debera caer en su vejiga dentro del da siguiente ms o menos. Puede llamar al consultorio del  urlogo Dr. Zigmund Daniel los documentos de alta para programar una cita de seguimiento con respecto a su clculo. Si sus sntomas empeoran en cualquier momento, no espere a su cita de seguimiento y, en su lugar, regrese al departamento de emergencias. 4. Su tomografa computarizada tambin mostr clculos biliares hoy. Hable con su proveedor de atencin primaria en su visita de seguimiento. 5. Puede tomar el medicamento Norco (hidrocodona/acetaminofn) segn lo prescrito para Engineer, materials intenso. Este medicamento lo adormecer, as que no conduzca, beba alcohol, tome otros medicamentos sedantes ni realice actividades peligrosas, como conducir, despus de tomar Norco. Norco contiene Tylenol (acetaminofeno), as que no tome ningn otro producto que contenga Tylenol con Dorathy Kinsman al Departamento de Emergencias ms cercano de inmediato si: ? Tienes fiebre o escalofros ? Te duele mucho. ? Tiene un dolor nuevo en el vientre (abdomen). ? Te desmayas (desmayo). ? No puedes orinar. ? Tiene dolor despus de comer o vomitar ? Tiene algn sntoma nuevo/preocupante o empeoramiento de los sntomas  Lea los paquetes de informacin adicional adjuntos a su resumen de alta.  No tome su medicamento si presenta sarpullido con picazn, hinchazn en la boca o los labios, o dificultad para respirar; llame al 911 y busque atencin mdica de emergencia inmediata si esto ocurre.  Puede revisar sus pruebas de laboratorio y [place] de imgenes en su totalidad en su cuenta MyChart. Analice todos los resultados de forma completa con su proveedor de atencin primaria y otro especialista en su visita de seguimiento.  Nota: Es posible que partes de este texto se hayan transcrito con un software de Network engineer de voz. Se hizo todo lo posible para garantizar la precisin; sin embargo, an pueden estar presentes errores de transcripcin computarizados involuntarios.

## 2020-12-06 NOTE — ED Notes (Signed)
Lab adding urine pregnancy to existing specimen

## 2020-12-07 LAB — URINE CULTURE: Culture: 60000 — AB

## 2021-09-18 ENCOUNTER — Ambulatory Visit (INDEPENDENT_AMBULATORY_CARE_PROVIDER_SITE_OTHER): Payer: Self-pay | Admitting: Primary Care

## 2021-09-18 ENCOUNTER — Other Ambulatory Visit: Payer: Self-pay

## 2021-09-18 ENCOUNTER — Encounter (INDEPENDENT_AMBULATORY_CARE_PROVIDER_SITE_OTHER): Payer: Self-pay | Admitting: Primary Care

## 2021-09-18 VITALS — BP 113/77 | HR 63 | Temp 97.5°F | Ht 64.5 in | Wt 164.0 lb

## 2021-09-18 DIAGNOSIS — N921 Excessive and frequent menstruation with irregular cycle: Secondary | ICD-10-CM

## 2021-09-18 DIAGNOSIS — M25532 Pain in left wrist: Secondary | ICD-10-CM

## 2021-09-18 DIAGNOSIS — M25531 Pain in right wrist: Secondary | ICD-10-CM

## 2021-09-18 DIAGNOSIS — M545 Low back pain, unspecified: Secondary | ICD-10-CM

## 2021-09-18 NOTE — Patient Instructions (Signed)
Puede tomar naproxeno (Aleve) 500 mg al inicio de la menstruación y 3 a 5 horas después; luego 250 mg a 500 mg dos veces al día O puede tomar 600 mg de ibuprofeno (Motrin) cada 6 horas comenzando 2-3 días antes del inicio de la menstruación. °

## 2021-09-18 NOTE — Progress Notes (Signed)
Has mucus in blood from vagina on last tuesday Lots of cramps during menses  Eyelid twitches/tingles  Hands are numb in the am  Wants to know if she still has kidney stones

## 2021-09-18 NOTE — Progress Notes (Signed)
   [facility]  HPI Ms.Tynlee N. [name] 35 y.o.Hispanic female ( interpreter Ena Dawley [phone]) presents for back pain right > left ) right side lower back radiating down to her knees 8/10 .  Last Tuesday had vaginal discharge mucus in blood self resolved.  She complains of a lot cramps during menses normally last unknown IUD spotting irregular.  Eyelid twitches/tingles - normal nerve . Hands are numb in the am when she wakes up- feels like ants crawls neuropathy negative for phalens and positive for tinnels  Wants to know if she still has kidney stones - unknown without KUB. CVA negative,  Past Medical History:  Diagnosis Date   Anxiety    Pseudotumor cerebri 04/03/2017     No Known Allergies    Current Outpatient Medications on File Prior to Visit  Medication Sig Dispense Refill   ibuprofen (ADVIL) 800 MG tablet Take 1 tablet (800 mg total) by mouth every 8 (eight) hours as needed for moderate pain. 90 tablet 1   levonorgestrel (MIRENA) 20 MCG/24HR IUD 1 each by Intrauterine route once. Implanted November 2017     Multiple Vitamin (MULTIVITAMIN WITH MINERALS) TABS tablet Take 1 tablet by mouth daily.     No current facility-administered medications on file prior to visit.    ROS: all negative except above.   Physical Exam: Filed Weights   09/18/21 0936  Weight: 164 lb (74.4 kg)   BP 113/77 (BP Location: Right Arm, Patient Position: Sitting, Cuff Size: Normal)   Pulse 63   Temp (!) 97.5 F (36.4 C) (Temporal)   Ht 5' 4.5" (1.638 m)   Wt 164 lb (74.4 kg)   SpO2 97%   BMI 27.72 kg/m  General Appearance: Well nourished, in no apparent distress. Eyes: PERRLA, EOMs, conjunctiva no swelling or erythema Sinuses: No Frontal/maxillary tenderness ENT/Mouth: Ext aud canals clear, TMs without erythema, bulging.  Hearing normal.  Neck: Supple, thyroid normal.  Respiratory: Respiratory effort normal, BS equal bilaterally without rales, rhonchi, wheezing or  stridor.  Cardio: RRR with no MRGs. Brisk peripheral pulses without edema.  Abdomen: Soft, + BS.  Non tender, no guarding, rebound, hernias, masses. Lymphatics: Non tender without lymphadenopathy.  Musculoskeletal: Full ROM, 5/5 strength, normal gait.  Skin: Warm, dry without rashes, lesions, ecchymosis.  Neuro: Cranial nerves intact. Normal muscle tone, no cerebellar symptoms. Sensation intact.  Psych: Awake and oriented X 3, normal affect, Insight and Judgment appropriate.   Lashawn was seen today for back pain and abdominal pain.  Diagnoses and all orders for this visit:  Back pain at L4-L5 level  back pain right > left ) right side lower back radiating down to her knees 8/10 May alternate with heat and ice application for pain relief. May also alternate with acetaminophen and Ibuprofen OTC pain relief. Other alternatives include massage, acupuncture and water aerobics.  You must stay active and avoid a sedentary lifestyle.  Advised to ask for financial assistance and would be able to refer to ortho Pain in both wrists Negative for Phalen and positive for Tinel differential dx carpal tunnel - advise to try conservative tx. Images of wrist braced showed    Menorrhagia with irregular cycle  Heavy vaginal bleeding with menstrual cycles.Menstrual cycles are normal.   Grayce Sessions, NP 9:54 AM

## 2024-05-08 ENCOUNTER — Ambulatory Visit
Admission: RE | Admit: 2024-05-08 | Discharge: 2024-05-08 | Disposition: A | Discharge status: Home / Self Care | Payer: Self-pay | Source: Ambulatory Visit | Attending: Primary Care | Admitting: Primary Care

## 2024-05-08 ENCOUNTER — Encounter (HOSPITAL_COMMUNITY): Payer: Self-pay

## 2024-05-08 ENCOUNTER — Emergency Department (HOSPITAL_COMMUNITY): Payer: Self-pay

## 2024-05-08 ENCOUNTER — Emergency Department (HOSPITAL_COMMUNITY)
Admission: EM | Admit: 2024-05-08 | Discharge: 2024-05-08 | Disposition: A | Discharge status: Home / Self Care | Payer: Self-pay | Attending: Emergency Medicine | Admitting: Emergency Medicine

## 2024-05-08 ENCOUNTER — Other Ambulatory Visit: Payer: Self-pay

## 2024-05-08 VITALS — BP 122/75 | HR 55 | Temp 98.3°F | Resp 16

## 2024-05-08 DIAGNOSIS — R1031 Right lower quadrant pain: Secondary | ICD-10-CM

## 2024-05-08 DIAGNOSIS — N83201 Unspecified ovarian cyst, right side: Secondary | ICD-10-CM | POA: Insufficient documentation

## 2024-05-08 LAB — CBC
HCT: 43.6 % (ref 36.0–46.0)
Hemoglobin: 14.9 g/dL (ref 12.0–15.0)
MCH: 30.8 pg (ref 26.0–34.0)
MCHC: 34.2 g/dL (ref 30.0–36.0)
MCV: 90.3 fL (ref 80.0–100.0)
Platelets: 241 10*3/uL (ref 150–400)
RBC: 4.83 MIL/uL (ref 3.87–5.11)
RDW: 12.7 % (ref 11.5–15.5)
WBC: 7.3 10*3/uL (ref 4.0–10.5)
nRBC: 0 % (ref 0.0–0.2)

## 2024-05-08 LAB — URINALYSIS, ROUTINE W REFLEX MICROSCOPIC
Bacteria, UA: NONE SEEN
Bilirubin Urine: NEGATIVE
Glucose, UA: NEGATIVE mg/dL
Hgb urine dipstick: NEGATIVE
Ketones, ur: NEGATIVE mg/dL
Nitrite: NEGATIVE
Protein, ur: NEGATIVE mg/dL
Specific Gravity, Urine: >1.046 — ABNORMAL HIGH (ref 1.005–1.030)
pH: 7 (ref 5.0–8.0)

## 2024-05-08 LAB — HCG, SERUM, QUALITATIVE: Preg, Serum: NEGATIVE

## 2024-05-08 LAB — COMPREHENSIVE METABOLIC PANEL WITH GFR
ALT: 25 U/L (ref 0–44)
AST: 11 U/L — ABNORMAL LOW (ref 15–41)
Albumin: 4.2 g/dL (ref 3.5–5.0)
Alkaline Phosphatase: 93 U/L (ref 38–126)
Anion gap: 9 (ref 5–15)
BUN: 14 mg/dL (ref 6–20)
CO2: 24 mmol/L (ref 22–32)
Calcium: 9.2 mg/dL (ref 8.9–10.3)
Chloride: 107 mmol/L (ref 98–111)
Creatinine, Ser: 0.7 mg/dL (ref 0.44–1.00)
GFR, Estimated: >60 mL/min (ref >60)
Glucose, Bld: 96 mg/dL (ref 70–99)
Potassium: 4.2 mmol/L (ref 3.5–5.1)
Sodium: 140 mmol/L (ref 135–145)
Total Bilirubin: 0.7 mg/dL (ref 0.0–1.2)
Total Protein: 8.1 g/dL (ref 6.5–8.1)

## 2024-05-08 LAB — LIPASE, BLOOD: Lipase: 35 U/L (ref 11–51)

## 2024-05-08 MED ORDER — IBUPROFEN 600 MG PO TABS: 600.0000 mg | ORAL_TABLET | Freq: Four times a day (QID) | ORAL | 0 refills | Status: DC | PRN

## 2024-05-08 MED ORDER — IOHEXOL 300 MG/ML  SOLN
100.0000 mL | Freq: Once | INTRAMUSCULAR | Status: AC | PRN
  Administered 2024-05-08: 100 mL via INTRAVENOUS

## 2024-05-08 NOTE — ED Provider Notes (Signed)
 [place] EMERGENCY DEPARTMENT AT [facility] Provider Note   CSN: [id number] Arrival date & time: 05/08/24  1031     Patient presents with: Abdominal Pain   Savannah [name] is a 38 y.o. female.  {Add pertinent medical, surgical, social history, OB history to YEP:67052} Patient sent from Urgent Care for evaluation of pain in the RLQ abdomen that started 3 days ago. No fever. She reports nausea yesterday that has resolved and did not result in vomiting. No history of previous abdominal surgeries. She denies vaginal discharge or pelvic pain but reports a small amount of vaginal bleeding the other day which is unusual. No urinary symptoms.   The history is provided by the patient. No language interpreter was used.  Abdominal Pain      Prior to Admission medications   Medication Sig Start Date End Date Taking? Authorizing Provider  ibuprofen  (ADVIL ) 800 MG tablet Take 1 tablet (800 mg total) by mouth every 8 (eight) hours as needed for moderate pain. 09/14/19   Celestia Rosaline SQUIBB, NP  levonorgestrel (MIRENA) 20 MCG/24HR IUD 1 each by Intrauterine route once. Implanted November 2017    [provider]  Multiple Vitamin (MULTIVITAMIN WITH MINERALS) TABS tablet Take 1 tablet by mouth daily.    [provider]    Allergies: Patient has no known allergies.    Review of Systems  Gastrointestinal:  Positive for abdominal pain.    Updated Vital Signs BP 135/86   Pulse 64   Temp 98.7 F (37.1 C) (Oral)   Resp 17   SpO2 99%   Physical Exam Vitals and nursing note reviewed.  Constitutional:      Appearance: She is well-developed.  HENT:     Head: Normocephalic.   Cardiovascular:     Rate and Rhythm: Normal rate and regular rhythm.     Heart sounds: No murmur heard. Pulmonary:     Effort: Pulmonary effort is normal.     Breath sounds: Normal breath sounds. No wheezing, rhonchi or rales.  Abdominal:     General: Bowel sounds are normal.      Palpations: Abdomen is soft.     Tenderness: There is abdominal tenderness in the right lower quadrant. There is no guarding or rebound.   Musculoskeletal:        General: Normal range of motion.     Cervical back: Normal range of motion and neck supple.   Skin:    General: Skin is warm and dry.   Neurological:     General: No focal deficit present.     Mental Status: She is alert and oriented to person, place, and time.     (all labs ordered are listed, but only abnormal results are displayed) Labs Reviewed  LIPASE, BLOOD  COMPREHENSIVE METABOLIC PANEL WITH GFR  CBC  URINALYSIS, ROUTINE W REFLEX MICROSCOPIC  HCG, SERUM, QUALITATIVE   Results for orders placed or performed during the hospital encounter of 05/08/24  Lipase, blood   Collection Time: 05/08/24  1:26 PM  Result Value Ref Range   Lipase 35 11 - 51 U/L  Comprehensive metabolic panel   Collection Time: 05/08/24  1:26 PM  Result Value Ref Range   Sodium 140 135 - 145 mmol/L   Potassium 4.2 3.5 - 5.1 mmol/L   Chloride 107 98 - 111 mmol/L   CO2 24 22 - 32 mmol/L   Glucose, Bld 96 70 - 99 mg/dL   BUN 14 6 - 20 mg/dL  Creatinine, Ser 0.70 0.44 - 1.00 mg/dL   Calcium 9.2 8.9 - 89.6 mg/dL   Total Protein 8.1 6.5 - 8.1 g/dL   Albumin 4.2 3.5 - 5.0 g/dL   AST 11 (L) 15 - 41 U/L   ALT 25 0 - 44 U/L   Alkaline Phosphatase 93 38 - 126 U/L   Total Bilirubin 0.7 0.0 - 1.2 mg/dL   GFR, Estimated >39 >39 mL/min   Anion gap 9 5 - 15  CBC   Collection Time: 05/08/24  1:26 PM  Result Value Ref Range   WBC 7.3 4.0 - 10.5 K/uL   RBC 4.83 3.87 - 5.11 MIL/uL   Hemoglobin 14.9 12.0 - 15.0 g/dL   HCT 56.3 63.9 - 53.9 %   MCV 90.3 80.0 - 100.0 fL   MCH 30.8 26.0 - 34.0 pg   MCHC 34.2 30.0 - 36.0 g/dL   RDW 87.2 88.4 - 84.4 %   Platelets 241 150 - 400 K/uL   nRBC 0.0 0.0 - 0.2 %  hCG, serum, qualitative   Collection Time: 05/08/24  1:26 PM  Result Value Ref Range   Preg, Serum NEGATIVE NEGATIVE     EKG: None  Radiology: No results found.  {Document cardiac monitor, telemetry assessment procedure when appropriate:32947} Procedures   Medications Ordered in the ED - No data to display    {Click here for ABCD2, HEART and other calculators REFRESH Note before signing:1}                              Medical Decision Making This patient presents to the ED for concern of RLQ abdominal pain, this involves an extensive number of treatment options, and is a complaint that carries with it a high risk of complications and morbidity.  The differential diagnosis includes appendicitis, colitis, mass, obstruction, ovarian etiology, pelvic infection   Co morbidities that complicate the patient evaluation  No treated medical conditions   Additional history obtained:  Additional history and/or information obtained from chart review, notable for Urgent Care note just prior to arrival without additional information   Lab Tests:  I Ordered, and personally interpreted labs.  The pertinent results include:   CBC: no leukocytosis, normal hgb, normal plts Pregnancy - negative Cmet: no grossly abnormal values     Imaging Studies ordered:  I ordered imaging studies including CT abd/pel Per radiologist interpretation:  IMPRESSION: 1. A 5 cm simple appearing cyst in the right ovary/adnexa. No surrounding inflammation. Per consensus guidelines for incidental cyst of this size, no specific imaging follow-up is needed. However, given right lower quadrant pain, consider ultrasound assessment. 2. Normal appendix. 3. Cholelithiasis without cholecystitis.    Cardiac Monitoring:  The patient was maintained on a cardiac monitor.  I personally viewed and interpreted the cardiac monitored which showed an underlying rhythm of: n/a   Medicines ordered and prescription drug management:  I ordered medication including n/a  for n/a Reevaluation of the patient after these medicines showed  that the patient declined pain medications I have reviewed the patients home medicines and have made adjustments as needed   Test Considered:  N/a   Critical Interventions:  N/a   Consultations Obtained:  I requested consultation with the n,  and discussed lab and imaging findings as well as pertinent plan - they recommend: n/a   Problem List / ED Course:  Here from Urgent Care with RLQ pain No fever, vomiting Tender in  RLQ, No guarding VSS  Labs reviewed and are reassuring - no WBC count, no evidence of infection CT abd/pel c/w right ovarian cyst Do not feel pursuing ultrasound is    Reevaluation:  After the interventions noted above, I reevaluated the patient and found that they have :{resolved/improved/worsened:23923::improved}   Social Determinants of Health:  ***   Disposition:  After consideration of the diagnostic results and the patients response to treatment, I feel that the patient would benefit from ***.   Amount and/or Complexity of Data Reviewed Labs: ordered. Radiology: ordered.  Risk Prescription drug management.   ***  {Document critical care time when appropriate  Document review of labs and clinical decision tools ie CHADS2VASC2, etc  Document your independent review of radiology images and any outside records  Document your discussion with family members, caretakers and with consultants  Document social determinants of health affecting pt's care  Document your decision making why or why not admission, treatments were needed:32947:::1}   Final diagnoses:  None    ED Discharge Orders     None

## 2024-05-08 NOTE — ED Triage Notes (Signed)
 For 3 days I have had abdominal pain on the right side. - Entered by patient  Video interpreter [place] [phone]  Denies n/v/d. Last BM yesterday. She had some vaginal spotting yesterday- she has an IUD

## 2024-05-08 NOTE — ED Notes (Signed)
 Patient is being discharged from the Urgent Care and sent to the Emergency Department via POV . Per Asberry Senters, PA-C, patient is in need of higher level of care due to right side abdominal pain. Patient is aware and verbalizes understanding of plan of care.  Vitals:   05/08/24 0959  BP: 122/75  Pulse: (!) 55  Resp: 16  Temp: 98.3 F (36.8 C)  SpO2: 98%

## 2024-05-08 NOTE — ED Triage Notes (Signed)
 PT arrives via POV. Declined interpreter. PT c/o right lower quadrant pain for the past 4 days. Pt denies n/v/d or urinary symptoms. Reports she did have a small amount of spotting when she wiped a couple of days ago. States this has been normal for her after getting an IUD.

## 2024-05-08 NOTE — ED Notes (Signed)
 Declined last set vs

## 2024-05-08 NOTE — ED Notes (Signed)
 Pt can't leave a urine sample right now

## 2024-05-08 NOTE — ED Provider Notes (Signed)
 Patient presents to urgent care for evaluation of right lower side abdominal pain that is been ongoing for 3 days.  She reports that symptoms come out of nowhere and are significant.  She denies any vomiting with symptoms.  Her last bowel movement was yesterday.  She has an IUD and states she has had some vaginal spotting yesterday.  Given significant pain with no known etiology recommended further evaluation in the emergency room for imaging.  Patient is agreeable with same.   Billy Asberry FALCON, PA-C 05/08/24 1011

## 2024-05-08 NOTE — Discharge Instructions (Addendum)
 Tome ibuprofeno segn lo prescrito para Chief Technology Officer causado por un quiste ovrico. Si el dolor persiste ms de 2 o 3 [address]  [address], [address] con [address] [place], como se indic anteriormente. Si presenta dolor intenso, fiebre o sangrado o flujo vaginal inusual, acuda a urgencias para una evaluacin adicional.  Take ibuprofen  as prescribed for pain from ovarian cyst. If pain persists longer than 2-3 days, follow up with the [facility] as referred above. If you develop severe pain, run a fever, have unusual vaginal bleeding or discharge, return to the emergency department for further evaluation.

## 2024-12-06 ENCOUNTER — Other Ambulatory Visit: Payer: Self-pay

## 2024-12-06 ENCOUNTER — Encounter (HOSPITAL_COMMUNITY): Payer: Self-pay

## 2024-12-06 ENCOUNTER — Emergency Department (HOSPITAL_COMMUNITY)
Admission: EM | Admit: 2024-12-06 | Discharge: 2024-12-07 | Disposition: A | Payer: Self-pay | Attending: Emergency Medicine | Admitting: Emergency Medicine

## 2024-12-06 DIAGNOSIS — L03818 Cellulitis of other sites: Secondary | ICD-10-CM | POA: Insufficient documentation

## 2024-12-06 NOTE — ED Triage Notes (Signed)
 Pt states that she has a bump on her right labia since Thursday.

## 2024-12-07 MED ORDER — IBUPROFEN 200 MG PO TABS
600.0000 mg | ORAL_TABLET | Freq: Once | ORAL | Status: AC
  Administered 2024-12-07: 600 mg via ORAL
  Filled 2024-12-07: qty 3

## 2024-12-07 MED ORDER — IBUPROFEN 600 MG PO TABS: 600.0000 mg | ORAL_TABLET | Freq: Four times a day (QID) | ORAL | 0 refills | Status: AC | PRN

## 2024-12-07 MED ORDER — LIDOCAINE-EPINEPHRINE-TETRACAINE (LET) TOPICAL GEL
3.0000 mL | Freq: Once | TOPICAL | Status: AC
  Administered 2024-12-07: 3 mL via TOPICAL
  Filled 2024-12-07: qty 3

## 2024-12-07 MED ORDER — AMOXICILLIN-POT CLAVULANATE 875-125 MG PO TABS
1.0000 | ORAL_TABLET | Freq: Once | ORAL | Status: AC
  Administered 2024-12-07: 1 via ORAL
  Filled 2024-12-07: qty 1

## 2024-12-07 MED ORDER — AMOXICILLIN-POT CLAVULANATE 875-125 MG PO TABS: 1.0000 | ORAL_TABLET | Freq: Two times a day (BID) | ORAL | 0 refills | Status: DC

## 2024-12-07 NOTE — ED Provider Notes (Signed)
 " [place] EMERGENCY DEPARTMENT AT [facility] Provider Note   CSN: [id number] Arrival date & time: 12/06/24  2152     Patient presents with: Abscess   Savannah [name] is a 39 y.o. female.   The history is provided by the patient.  Abscess Savannah [name] is a 39 y.o. female who presents to the Emergency Department complaining of labial swelling.  She presents to the emergency department for evaluation of right labial swelling that started on Thursday.  She initially thought she had a pimple in the area and that it significantly increased in size over the last 2 days.  She is not having any fever, abdominal pain, dysuria, vaginal discharge.  No prior similar symptoms.  Denies any chance of pregnancy.  She does have a Mirena IUD in place that was [facility].  No new sexual partners.  She is in a monogamous relationship with her spouse.    She has no known medical problems.    Prior to Admission medications  Medication Sig Start Date End Date Taking? Authorizing Provider  amoxicillin -clavulanate (AUGMENTIN ) 875-125 MG tablet Take 1 tablet by mouth every 12 (twelve) hours. 12/07/24  Yes Griselda Norris, MD  ibuprofen  (ADVIL ) 600 MG tablet Take 1 tablet (600 mg total) by mouth every 6 (six) hours as needed. 12/07/24  Yes Griselda Norris, MD  levonorgestrel [facility]) 20 MCG/24HR IUD 1 each by Intrauterine route once. Implanted November 2017    [provider]  Multiple Vitamin (MULTIVITAMIN WITH MINERALS) TABS tablet Take 1 tablet by mouth daily.    [provider]    Allergies: Patient has no known allergies.    Review of Systems  All other systems reviewed and are negative.   Updated Vital Signs BP 127/88   Pulse 77   Temp 98 F (36.7 C) (Oral)   Resp 16   SpO2 100%   Physical Exam Vitals and nursing note reviewed.  Constitutional:      Appearance: She is well-developed.  HENT:     Head:  Normocephalic and atraumatic.  Cardiovascular:     Rate and Rhythm: Normal rate and regular rhythm.  Pulmonary:     Effort: Pulmonary effort is normal. No respiratory distress.  Abdominal:     Palpations: Abdomen is soft.     Tenderness: There is no abdominal tenderness. There is no guarding or rebound.  Genitourinary:    Comments: Pelvic examination with moderate edema and tenderness throughout the entire labia, there is no focal fluctuance or discrete area of induration.  There is no drainage.  There is no vaginal discharge.  Examination chaperoned by nurse [place] Musculoskeletal:        General: No tenderness.  Skin:    General: Skin is warm and dry.  Neurological:     Mental Status: She is alert and oriented to person, place, and time.  Psychiatric:        Behavior: Behavior normal.     (all labs ordered are listed, but only abnormal results are displayed) Labs Reviewed - No data to display  EKG: None  Radiology: No results found.   Procedures   Medications Ordered in the ED  lidocaine -EPINEPHrine -tetracaine  (LET) topical gel (3 mLs Topical Given 12/07/24 0058)  amoxicillin -clavulanate (AUGMENTIN ) 875-125 MG per tablet 1 tablet (1 tablet Oral Given 12/07/24 0233)  ibuprofen  (ADVIL ) tablet 600 mg (600 mg Oral Given 12/07/24 0233)  Medical Decision Making Risk OTC drugs. Prescription drug management.   Patient here for evaluation of labial pain and swelling, examination is consistent with cellulitis.  The area where she initially had a pimple present is the region of the Bartholin's gland.  Suspect she had a Bartholin's gland abscess that spontaneously drained.  There is no drainable collection at this time.  Will start on antibiotics given her local swelling.  Discussed that she will need to follow-up with gynecology for further evaluation.  Also discussed return precautions for progressive or concerning symptoms.  Current picture is  not consistent with necrotizing soft tissue infection.     Final diagnoses:  Cellulitis of other specified site    ED Discharge Orders          Ordered    amoxicillin -clavulanate (AUGMENTIN ) 875-125 MG tablet  Every 12 hours        12/07/24 0229    ibuprofen  (ADVIL ) 600 MG tablet  Every 6 hours PRN        12/07/24 0229               Griselda Norris, MD 12/07/24 [phone]  "

## 2024-12-13 ENCOUNTER — Encounter (HOSPITAL_COMMUNITY): Payer: Self-pay | Admitting: Emergency Medicine

## 2024-12-13 ENCOUNTER — Emergency Department (HOSPITAL_COMMUNITY)
Admission: EM | Admit: 2024-12-13 | Discharge: 2024-12-13 | Disposition: A | Discharge status: Home / Self Care | Payer: Self-pay | Attending: Emergency Medicine | Admitting: Emergency Medicine

## 2024-12-13 ENCOUNTER — Other Ambulatory Visit: Payer: Self-pay

## 2024-12-13 ENCOUNTER — Ambulatory Visit: Payer: Self-pay

## 2024-12-13 ENCOUNTER — Emergency Department (HOSPITAL_COMMUNITY): Payer: Self-pay

## 2024-12-13 ENCOUNTER — Ambulatory Visit (HOSPITAL_COMMUNITY): Payer: Self-pay

## 2024-12-13 DIAGNOSIS — K529 Noninfective gastroenteritis and colitis, unspecified: Secondary | ICD-10-CM | POA: Insufficient documentation

## 2024-12-13 LAB — COMPREHENSIVE METABOLIC PANEL WITH GFR
ALT: 18 U/L (ref 0–44)
AST: 22 U/L (ref 15–41)
Albumin: 4.2 g/dL (ref 3.5–5.0)
Alkaline Phosphatase: 117 U/L (ref 38–126)
Anion gap: 11 (ref 5–15)
BUN: 9 mg/dL (ref 6–20)
CO2: 23 mmol/L (ref 22–32)
Calcium: 9.3 mg/dL (ref 8.9–10.3)
Chloride: 105 mmol/L (ref 98–111)
Creatinine, Ser: 0.76 mg/dL (ref 0.44–1.00)
GFR, Estimated: >60 mL/min
Glucose, Bld: 123 mg/dL — ABNORMAL HIGH (ref 70–99)
Potassium: 3.6 mmol/L (ref 3.5–5.1)
Sodium: 138 mmol/L (ref 135–145)
Total Bilirubin: 0.5 mg/dL (ref 0.0–1.2)
Total Protein: 7.6 g/dL (ref 6.5–8.1)

## 2024-12-13 LAB — URINALYSIS, ROUTINE W REFLEX MICROSCOPIC
Bilirubin Urine: NEGATIVE
Glucose, UA: NEGATIVE mg/dL
Ketones, ur: NEGATIVE mg/dL
Nitrite: NEGATIVE
Protein, ur: 30 mg/dL — AB
Specific Gravity, Urine: 1.031 — ABNORMAL HIGH (ref 1.005–1.030)
pH: 5 (ref 5.0–8.0)

## 2024-12-13 LAB — HCG, SERUM, QUALITATIVE: Preg, Serum: NEGATIVE

## 2024-12-13 LAB — CBC
HCT: 42.6 % (ref 36.0–46.0)
Hemoglobin: 15.4 g/dL — ABNORMAL HIGH (ref 12.0–15.0)
MCH: 31.5 pg (ref 26.0–34.0)
MCHC: 36.2 g/dL — ABNORMAL HIGH (ref 30.0–36.0)
MCV: 87.1 fL (ref 80.0–100.0)
Platelets: 320 10*3/uL (ref 150–400)
RBC: 4.89 MIL/uL (ref 3.87–5.11)
RDW: 12.4 % (ref 11.5–15.5)
WBC: 13.1 10*3/uL — ABNORMAL HIGH (ref 4.0–10.5)
nRBC: 0 % (ref 0.0–0.2)

## 2024-12-13 LAB — LIPASE, BLOOD: Lipase: 22 U/L (ref 11–51)

## 2024-12-13 MED ORDER — AMOXICILLIN-POT CLAVULANATE 875-125 MG PO TABS: 1.0000 | ORAL_TABLET | Freq: Two times a day (BID) | ORAL | 0 refills | Status: AC

## 2024-12-13 MED ORDER — IOHEXOL 300 MG/ML  SOLN
100.0000 mL | Freq: Once | INTRAMUSCULAR | Status: AC | PRN
  Administered 2024-12-13: 100 mL via INTRAVENOUS

## 2024-12-13 MED ORDER — MORPHINE SULFATE (PF) 4 MG/ML IV SOLN
4.0000 mg | Freq: Once | INTRAVENOUS | Status: AC
  Administered 2024-12-13: 4 mg via INTRAVENOUS
  Filled 2024-12-13: qty 1

## 2024-12-13 MED ORDER — AMOXICILLIN-POT CLAVULANATE 875-125 MG PO TABS
1.0000 | ORAL_TABLET | Freq: Once | ORAL | Status: AC
  Administered 2024-12-13: 1 via ORAL
  Filled 2024-12-13: qty 1

## 2024-12-13 MED ORDER — SODIUM CHLORIDE 0.9 % IV BOLUS
1000.0000 mL | Freq: Once | INTRAVENOUS | Status: AC
  Administered 2024-12-13: 1000 mL via INTRAVENOUS

## 2024-12-13 MED ORDER — ONDANSETRON HCL 4 MG/2ML IJ SOLN
4.0000 mg | Freq: Once | INTRAMUSCULAR | Status: AC
  Administered 2024-12-13: 4 mg via INTRAVENOUS
  Filled 2024-12-13: qty 2

## 2024-12-13 NOTE — ED Triage Notes (Signed)
 Patient coming to ED for evaluation of diarrhea and abdominal pain.  Reports diarrhea started yesterday, followed by abdominal pain.  Did have one episode of vomiting.

## 2024-12-13 NOTE — Discharge Instructions (Addendum)
 Evaluate today was concerning for colitis.  This is inflammation of the colon.  Treatment is Augmentin .  It appears you are already taking it for a skin infection.  I am going to extend your course for another 3 days.  Please follow-up with gastroenterology.  Schedule an appointment as soon as you can.  If you develop blood in the stool, worsening abdominal pain develop a fever or persistent vomiting or any other concerning symptom please return to ED for further evaluation.
